# Patient Record
Sex: Male | Born: 1950
Health system: Southern US, Community
[De-identification: ages and names within clinical notes are randomized; demographics above are authoritative.]

## PROBLEM LIST (undated history)

## (undated) DIAGNOSIS — C099 Malignant neoplasm of tonsil, unspecified: Secondary | ICD-10-CM

## (undated) DIAGNOSIS — I10 Essential (primary) hypertension: Secondary | ICD-10-CM

## (undated) DIAGNOSIS — Z9289 Personal history of other medical treatment: Secondary | ICD-10-CM

## (undated) DIAGNOSIS — K219 Gastro-esophageal reflux disease without esophagitis: Secondary | ICD-10-CM

## (undated) DIAGNOSIS — E785 Hyperlipidemia, unspecified: Secondary | ICD-10-CM

## (undated) DIAGNOSIS — E039 Hypothyroidism, unspecified: Secondary | ICD-10-CM

## (undated) DIAGNOSIS — I255 Ischemic cardiomyopathy: Secondary | ICD-10-CM

## (undated) DIAGNOSIS — I2510 Atherosclerotic heart disease of native coronary artery without angina pectoris: Secondary | ICD-10-CM

## (undated) HISTORY — PX: NM MYOVIEW LTD: HXRAD82

## (undated) HISTORY — DX: Ischemic cardiomyopathy: I25.5

## (undated) HISTORY — DX: Hyperlipidemia, unspecified: E78.5

## (undated) HISTORY — DX: Personal history of other medical treatment: Z92.89

## (undated) HISTORY — DX: Atherosclerotic heart disease of native coronary artery without angina pectoris: I25.10

## (undated) HISTORY — DX: Gastro-esophageal reflux disease without esophagitis: K21.9

## (undated) HISTORY — DX: Hypothyroidism, unspecified: E03.9

---

## 1998-01-24 HISTORY — PX: CORONARY ARTERY BYPASS GRAFT: SHX141

## 1998-05-13 DIAGNOSIS — I25119 Atherosclerotic heart disease of native coronary artery with unspecified angina pectoris: Secondary | ICD-10-CM | POA: Insufficient documentation

## 1998-06-28 ENCOUNTER — Inpatient Hospital Stay (HOSPITAL_COMMUNITY): Admission: EM | Admit: 1998-06-28 | Discharge: 1998-07-04 | Payer: Self-pay | Admitting: Cardiovascular Disease

## 1998-06-30 ENCOUNTER — Encounter: Payer: Self-pay | Admitting: Cardiovascular Disease

## 1998-10-14 ENCOUNTER — Encounter: Payer: Self-pay | Admitting: Cardiovascular Disease

## 1998-10-14 ENCOUNTER — Ambulatory Visit (HOSPITAL_COMMUNITY): Admission: RE | Admit: 1998-10-14 | Discharge: 1998-10-15 | Payer: Self-pay | Admitting: Cardiovascular Disease

## 1998-12-31 ENCOUNTER — Inpatient Hospital Stay (HOSPITAL_COMMUNITY): Admission: EM | Admit: 1998-12-31 | Discharge: 1999-01-10 | Payer: Self-pay | Admitting: Emergency Medicine

## 1998-12-31 ENCOUNTER — Encounter: Payer: Self-pay | Admitting: Cardiology

## 1999-01-04 ENCOUNTER — Encounter: Payer: Self-pay | Admitting: Thoracic Surgery (Cardiothoracic Vascular Surgery)

## 1999-01-05 ENCOUNTER — Encounter: Payer: Self-pay | Admitting: Thoracic Surgery (Cardiothoracic Vascular Surgery)

## 1999-01-06 ENCOUNTER — Encounter: Payer: Self-pay | Admitting: Thoracic Surgery (Cardiothoracic Vascular Surgery)

## 1999-01-27 ENCOUNTER — Encounter: Payer: Self-pay | Admitting: Thoracic Surgery (Cardiothoracic Vascular Surgery)

## 1999-01-27 ENCOUNTER — Encounter
Admission: RE | Admit: 1999-01-27 | Discharge: 1999-01-27 | Payer: Self-pay | Admitting: Thoracic Surgery (Cardiothoracic Vascular Surgery)

## 2000-10-23 ENCOUNTER — Emergency Department (HOSPITAL_COMMUNITY): Admission: EM | Admit: 2000-10-23 | Discharge: 2000-10-24 | Payer: Self-pay | Admitting: Emergency Medicine

## 2000-10-25 ENCOUNTER — Emergency Department (HOSPITAL_COMMUNITY): Admission: EM | Admit: 2000-10-25 | Discharge: 2000-10-25 | Payer: Self-pay | Admitting: *Deleted

## 2001-01-10 ENCOUNTER — Emergency Department (HOSPITAL_COMMUNITY): Admission: EM | Admit: 2001-01-10 | Discharge: 2001-01-10 | Payer: Self-pay

## 2001-01-11 ENCOUNTER — Emergency Department (HOSPITAL_COMMUNITY): Admission: EM | Admit: 2001-01-11 | Discharge: 2001-01-11 | Payer: Self-pay | Admitting: Emergency Medicine

## 2002-07-04 ENCOUNTER — Ambulatory Visit (HOSPITAL_COMMUNITY): Admission: RE | Admit: 2002-07-04 | Discharge: 2002-07-04 | Payer: Self-pay | Admitting: Internal Medicine

## 2002-07-04 ENCOUNTER — Encounter: Payer: Self-pay | Admitting: Internal Medicine

## 2002-09-19 ENCOUNTER — Ambulatory Visit (HOSPITAL_COMMUNITY): Admission: RE | Admit: 2002-09-19 | Discharge: 2002-09-19 | Payer: Self-pay | Admitting: *Deleted

## 2002-09-19 ENCOUNTER — Encounter (INDEPENDENT_AMBULATORY_CARE_PROVIDER_SITE_OTHER): Payer: Self-pay | Admitting: Specialist

## 2005-06-22 ENCOUNTER — Emergency Department (HOSPITAL_COMMUNITY): Admission: EM | Admit: 2005-06-22 | Discharge: 2005-06-22 | Payer: Self-pay | Admitting: Emergency Medicine

## 2009-01-24 DIAGNOSIS — Z9289 Personal history of other medical treatment: Secondary | ICD-10-CM

## 2009-01-24 HISTORY — DX: Personal history of other medical treatment: Z92.89

## 2009-01-24 HISTORY — PX: CARDIAC CATHETERIZATION: SHX172

## 2009-02-18 ENCOUNTER — Encounter: Admission: RE | Admit: 2009-02-18 | Discharge: 2009-02-18 | Payer: Self-pay | Admitting: Cardiology

## 2009-02-20 ENCOUNTER — Ambulatory Visit (HOSPITAL_COMMUNITY): Admission: RE | Admit: 2009-02-20 | Discharge: 2009-02-20 | Payer: Self-pay | Admitting: Cardiology

## 2010-04-07 ENCOUNTER — Other Ambulatory Visit: Payer: Self-pay | Admitting: Otolaryngology

## 2010-04-07 DIAGNOSIS — R221 Localized swelling, mass and lump, neck: Secondary | ICD-10-CM

## 2010-04-09 ENCOUNTER — Encounter (HOSPITAL_COMMUNITY)
Admission: RE | Admit: 2010-04-09 | Discharge: 2010-04-09 | Disposition: A | Discharge status: Home / Self Care | Payer: BC Managed Care – PPO | Source: Ambulatory Visit | Attending: Otolaryngology | Admitting: Otolaryngology

## 2010-04-09 ENCOUNTER — Ambulatory Visit
Admission: RE | Admit: 2010-04-09 | Discharge: 2010-04-09 | Disposition: A | Discharge status: Home / Self Care | Payer: BC Managed Care – PPO | Source: Ambulatory Visit | Attending: Otolaryngology | Admitting: Otolaryngology

## 2010-04-09 DIAGNOSIS — R221 Localized swelling, mass and lump, neck: Secondary | ICD-10-CM

## 2010-04-09 LAB — BASIC METABOLIC PANEL
CO2: 30 mEq/L (ref 19–32)
GFR calc Af Amer: >60 mL/min (ref >60)
GFR calc non Af Amer: >60 mL/min (ref >60)
Glucose, Bld: 109 mg/dL — ABNORMAL HIGH (ref 70–99)
Sodium: 143 mEq/L (ref 135–145)

## 2010-04-09 LAB — CBC
HCT: 45.8 % (ref 39.0–52.0)
MCHC: 36.7 g/dL — ABNORMAL HIGH (ref 30.0–36.0)
Platelets: 239 10*3/uL (ref 150–400)
RDW: 13.3 % (ref 11.5–15.5)
WBC: 13.8 10*3/uL — ABNORMAL HIGH (ref 4.0–10.5)

## 2010-04-09 LAB — SURGICAL PCR SCREEN: Staphylococcus aureus: NEGATIVE

## 2010-04-09 MED ORDER — IOHEXOL 300 MG/ML  SOLN
75.0000 mL | Freq: Once | INTRAMUSCULAR | Status: AC | PRN
  Administered 2010-04-09: 75 mL via INTRAVENOUS

## 2010-04-12 ENCOUNTER — Other Ambulatory Visit: Payer: Self-pay | Admitting: Otolaryngology

## 2010-04-12 ENCOUNTER — Ambulatory Visit (HOSPITAL_COMMUNITY)
Admission: RE | Admit: 2010-04-12 | Discharge: 2010-04-12 | Disposition: A | Discharge status: Home / Self Care | Payer: BC Managed Care – PPO | Source: Ambulatory Visit | Attending: Otolaryngology | Admitting: Otolaryngology

## 2010-04-12 DIAGNOSIS — I10 Essential (primary) hypertension: Secondary | ICD-10-CM | POA: Insufficient documentation

## 2010-04-12 DIAGNOSIS — F17200 Nicotine dependence, unspecified, uncomplicated: Secondary | ICD-10-CM | POA: Insufficient documentation

## 2010-04-12 DIAGNOSIS — Z01818 Encounter for other preprocedural examination: Secondary | ICD-10-CM | POA: Insufficient documentation

## 2010-04-12 DIAGNOSIS — C099 Malignant neoplasm of tonsil, unspecified: Secondary | ICD-10-CM | POA: Insufficient documentation

## 2010-04-12 DIAGNOSIS — Z01812 Encounter for preprocedural laboratory examination: Secondary | ICD-10-CM | POA: Insufficient documentation

## 2010-04-12 DIAGNOSIS — I2510 Atherosclerotic heart disease of native coronary artery without angina pectoris: Secondary | ICD-10-CM | POA: Insufficient documentation

## 2010-04-19 NOTE — Op Note (Signed)
  NAMEDENNIE, Mark Mendoza                ACCOUNT NO.:  [id number]  MEDICAL RECORD NO.:  [id number]           PATIENT TYPE:  O  LOCATION:  SDSC                         FACILITY:  [facility]  PHYSICIAN:  Janeann Paisley H. Pollyann Kennedy, MD     DATE OF BIRTH:  Aug 28, 1950  DATE OF PROCEDURE:  04/12/2010 DATE OF DISCHARGE:  04/12/2010                              OPERATIVE REPORT   REFERRING PHYSICIAN:  Soyla Murphy. Pharr, MD  PREOPERATIVE DIAGNOSES:  Left tonsil mass and left neck mass.  POSTOPERATIVE DIAGNOSES:  Left tonsil mass frozen section diagnosis consistent with squamous cell carcinoma, invasive and very small right base of tongue mass.  No other findings.  HISTORY:  This is a 60 year old with a 81-month history of a sensation in his left throat area and more recently a lump in the left upper neck. Risks, benefits, alternatives, and complications of the procedure were explained to the patient, who seemed to understand and agreed with surgery.  PROCEDURE IN DETAIL:  The patient was taken to the operating room and placed on the operating table in supine position.  Following induction of general endotracheal anesthesia, the table was turned and the patient was draped in a standard fashion.  1. Esophagoscopy:  A rigid esophagoscope was entered into the oral     cavity through the cricopharyngeus into the esophagus.  It was     slowly withdrawn from the esophagus while suctioning secretions.     Circumferential inspection of the esophageal mucosa did not reveal     any lesions.  1. Direct laryngoscopy:  Jako laryngoscope was used to evaluate the     hypopharynx and laryngeal structures.  All areas were thoroughly     evaluated.  There were no lesions noted except for a very tiny     right base of tongue papillary-type lesion, which was biopsied in     its entirety and sent for pathologic evaluation and for an abnormal     left tonsil.  Left tonsil was inspected using the Crowe-Davis     mouthgag and the  entire tonsil was firm, enlarged, papillary-type     consistency without any superficial ulceration, but multiple     biopsies were taken using electrocautery and forceps as well as     biopsy forceps.  Suction cautery was used for hemostasis.  This was     sent for frozen section, which was consistent with squamous cell     carcinoma.  The pharynx was irrigated with saline and suctioned and     orogastric tube was used to aspirate contents of the stomach.  The     patient was then awakened, extubated, and transferred to recovery     in stable condition.     Neilson Oehlert H. Pollyann Kennedy, MD    JHR/MEDQ  D:  04/12/2010  T:  04/13/2010  Job:  [id number]  Electronically Signed by Serena Colonel MD on 04/19/2010 10:07:47 PM

## 2010-04-20 ENCOUNTER — Other Ambulatory Visit: Payer: Self-pay | Admitting: Oncology

## 2010-04-20 ENCOUNTER — Encounter (HOSPITAL_BASED_OUTPATIENT_CLINIC_OR_DEPARTMENT_OTHER): Payer: BC Managed Care – PPO | Admitting: Oncology

## 2010-04-20 ENCOUNTER — Encounter: Payer: BC Managed Care – PPO | Admitting: Oncology

## 2010-04-20 ENCOUNTER — Other Ambulatory Visit (HOSPITAL_COMMUNITY): Payer: BC Managed Care – PPO | Admitting: Dentistry

## 2010-04-20 DIAGNOSIS — K0530 Chronic periodontitis, unspecified: Secondary | ICD-10-CM

## 2010-04-20 DIAGNOSIS — C099 Malignant neoplasm of tonsil, unspecified: Secondary | ICD-10-CM

## 2010-04-20 DIAGNOSIS — K036 Deposits [accretions] on teeth: Secondary | ICD-10-CM

## 2010-04-20 DIAGNOSIS — M278 Other specified diseases of jaws: Secondary | ICD-10-CM

## 2010-04-20 DIAGNOSIS — M2607 Excessive tuberosity of jaw: Secondary | ICD-10-CM

## 2010-04-20 LAB — COMPREHENSIVE METABOLIC PANEL
ALT: 36 U/L (ref 0–53)
AST: 41 U/L — ABNORMAL HIGH (ref 0–37)
BUN: 14 mg/dL (ref 6–23)
CO2: 26 mEq/L (ref 19–32)
Chloride: 100 mEq/L (ref 96–112)
Creatinine, Ser: 1 mg/dL (ref 0.40–1.50)
Glucose, Bld: 123 mg/dL — ABNORMAL HIGH (ref 70–99)
Sodium: 137 mEq/L (ref 135–145)
Total Protein: 7.1 g/dL (ref 6.0–8.3)

## 2010-04-20 LAB — CBC WITH DIFFERENTIAL/PLATELET
EOS%: 0.3 % (ref 0.0–7.0)
LYMPH%: 20.8 % (ref 14.0–49.0)
MCHC: 35.8 g/dL (ref 32.0–36.0)
MONO#: 1.2 10*3/uL — ABNORMAL HIGH (ref 0.1–0.9)
MONO%: 9.8 % (ref 0.0–14.0)
Platelets: 276 10*3/uL (ref 140–400)
RDW: 13.2 % (ref 11.0–14.6)

## 2010-04-23 ENCOUNTER — Ambulatory Visit: Payer: BC Managed Care – PPO | Attending: Radiation Oncology | Admitting: Radiation Oncology

## 2010-04-23 DIAGNOSIS — Z951 Presence of aortocoronary bypass graft: Secondary | ICD-10-CM | POA: Insufficient documentation

## 2010-04-23 DIAGNOSIS — I2510 Atherosclerotic heart disease of native coronary artery without angina pectoris: Secondary | ICD-10-CM | POA: Insufficient documentation

## 2010-04-23 DIAGNOSIS — K219 Gastro-esophageal reflux disease without esophagitis: Secondary | ICD-10-CM | POA: Insufficient documentation

## 2010-04-23 DIAGNOSIS — E785 Hyperlipidemia, unspecified: Secondary | ICD-10-CM | POA: Insufficient documentation

## 2010-04-23 DIAGNOSIS — C099 Malignant neoplasm of tonsil, unspecified: Secondary | ICD-10-CM | POA: Insufficient documentation

## 2010-04-23 DIAGNOSIS — I10 Essential (primary) hypertension: Secondary | ICD-10-CM | POA: Insufficient documentation

## 2010-04-23 DIAGNOSIS — Z809 Family history of malignant neoplasm, unspecified: Secondary | ICD-10-CM | POA: Insufficient documentation

## 2010-04-23 DIAGNOSIS — Z510 Encounter for antineoplastic radiation therapy: Secondary | ICD-10-CM | POA: Insufficient documentation

## 2010-04-26 ENCOUNTER — Encounter (HOSPITAL_COMMUNITY)
Admission: RE | Admit: 2010-04-26 | Discharge: 2010-04-26 | Disposition: A | Discharge status: Home / Self Care | Payer: BC Managed Care – PPO | Source: Ambulatory Visit | Attending: Oncology | Admitting: Oncology

## 2010-04-26 ENCOUNTER — Encounter (HOSPITAL_COMMUNITY): Payer: Self-pay

## 2010-04-26 DIAGNOSIS — R599 Enlarged lymph nodes, unspecified: Secondary | ICD-10-CM | POA: Insufficient documentation

## 2010-04-26 DIAGNOSIS — C770 Secondary and unspecified malignant neoplasm of lymph nodes of head, face and neck: Secondary | ICD-10-CM | POA: Insufficient documentation

## 2010-04-26 DIAGNOSIS — C099 Malignant neoplasm of tonsil, unspecified: Secondary | ICD-10-CM | POA: Insufficient documentation

## 2010-04-26 LAB — GLUCOSE, CAPILLARY: Glucose-Capillary: 112 mg/dL — ABNORMAL HIGH (ref 70–99)

## 2010-04-26 MED ORDER — FLUDEOXYGLUCOSE F - 18 (FDG) INJECTION
17.4000 | Freq: Once | INTRAVENOUS | Status: AC | PRN
  Administered 2010-04-26: 17.4 via INTRAVENOUS

## 2010-05-04 ENCOUNTER — Encounter (HOSPITAL_BASED_OUTPATIENT_CLINIC_OR_DEPARTMENT_OTHER): Payer: BC Managed Care – PPO | Admitting: Oncology

## 2010-05-04 DIAGNOSIS — C099 Malignant neoplasm of tonsil, unspecified: Secondary | ICD-10-CM

## 2010-05-04 DIAGNOSIS — I10 Essential (primary) hypertension: Secondary | ICD-10-CM

## 2010-05-04 DIAGNOSIS — F17200 Nicotine dependence, unspecified, uncomplicated: Secondary | ICD-10-CM

## 2010-05-04 DIAGNOSIS — E119 Type 2 diabetes mellitus without complications: Secondary | ICD-10-CM

## 2010-05-26 ENCOUNTER — Other Ambulatory Visit: Payer: Self-pay | Admitting: Oncology

## 2010-05-26 ENCOUNTER — Encounter (HOSPITAL_BASED_OUTPATIENT_CLINIC_OR_DEPARTMENT_OTHER): Payer: BC Managed Care – PPO | Admitting: Oncology

## 2010-05-26 DIAGNOSIS — Z5111 Encounter for antineoplastic chemotherapy: Secondary | ICD-10-CM

## 2010-05-26 DIAGNOSIS — C099 Malignant neoplasm of tonsil, unspecified: Secondary | ICD-10-CM

## 2010-05-26 LAB — COMPREHENSIVE METABOLIC PANEL
Albumin: 4.5 g/dL (ref 3.5–5.2)
CO2: 25 mEq/L (ref 19–32)
Glucose, Bld: 116 mg/dL — ABNORMAL HIGH (ref 70–99)
Sodium: 138 mEq/L (ref 135–145)

## 2010-05-26 LAB — CBC WITH DIFFERENTIAL/PLATELET
HCT: 42.6 % (ref 38.4–49.9)
HGB: 15.3 g/dL (ref 13.0–17.1)
LYMPH%: 25.9 % (ref 14.0–49.0)
MCH: 33.5 pg — ABNORMAL HIGH (ref 27.2–33.4)
MCHC: 35.9 g/dL (ref 32.0–36.0)
MCV: 93.2 fL (ref 79.3–98.0)
MONO#: 1.3 10*3/uL — ABNORMAL HIGH (ref 0.1–0.9)
MONO%: 12.7 % (ref 0.0–14.0)
NEUT%: 59.8 % (ref 39.0–75.0)
Platelets: 263 10*3/uL (ref 140–400)
RDW: 14.2 % (ref 11.0–14.6)

## 2010-06-02 ENCOUNTER — Encounter (HOSPITAL_BASED_OUTPATIENT_CLINIC_OR_DEPARTMENT_OTHER): Payer: BC Managed Care – PPO | Admitting: Oncology

## 2010-06-02 ENCOUNTER — Other Ambulatory Visit: Payer: Self-pay | Admitting: Oncology

## 2010-06-02 DIAGNOSIS — E46 Unspecified protein-calorie malnutrition: Secondary | ICD-10-CM

## 2010-06-02 DIAGNOSIS — C099 Malignant neoplasm of tonsil, unspecified: Secondary | ICD-10-CM

## 2010-06-02 DIAGNOSIS — I10 Essential (primary) hypertension: Secondary | ICD-10-CM

## 2010-06-02 DIAGNOSIS — Z5111 Encounter for antineoplastic chemotherapy: Secondary | ICD-10-CM

## 2010-06-02 LAB — COMPREHENSIVE METABOLIC PANEL
AST: 25 U/L (ref 0–37)
CO2: 24 mEq/L (ref 19–32)
Chloride: 100 mEq/L (ref 96–112)
Creatinine, Ser: 0.68 mg/dL (ref 0.40–1.50)
Glucose, Bld: 101 mg/dL — ABNORMAL HIGH (ref 70–99)
Sodium: 135 mEq/L (ref 135–145)
Total Bilirubin: 0.7 mg/dL (ref 0.3–1.2)
Total Protein: 7.1 g/dL (ref 6.0–8.3)

## 2010-06-02 LAB — CBC WITH DIFFERENTIAL/PLATELET
BASO%: 0.3 % (ref 0.0–2.0)
EOS%: 0.7 % (ref 0.0–7.0)
LYMPH%: 14.5 % (ref 14.0–49.0)
MCHC: 35.8 g/dL (ref 32.0–36.0)
MCV: 92.7 fL (ref 79.3–98.0)
MONO#: 1.5 10*3/uL — ABNORMAL HIGH (ref 0.1–0.9)
MONO%: 14.9 % — ABNORMAL HIGH (ref 0.0–14.0)
NEUT%: 69.6 % (ref 39.0–75.0)
Platelets: 228 10*3/uL (ref 140–400)
RBC: 4.13 10*6/uL — ABNORMAL LOW (ref 4.20–5.82)
RDW: 13.4 % (ref 11.0–14.6)
nRBC: 0 % (ref 0–0)

## 2010-06-08 ENCOUNTER — Other Ambulatory Visit: Payer: Self-pay | Admitting: Oncology

## 2010-06-08 ENCOUNTER — Encounter (HOSPITAL_BASED_OUTPATIENT_CLINIC_OR_DEPARTMENT_OTHER): Payer: BC Managed Care – PPO | Admitting: Oncology

## 2010-06-08 DIAGNOSIS — C099 Malignant neoplasm of tonsil, unspecified: Secondary | ICD-10-CM

## 2010-06-08 LAB — COMPREHENSIVE METABOLIC PANEL
ALT: 17 U/L (ref 0–53)
Albumin: 3.7 g/dL (ref 3.5–5.2)
Alkaline Phosphatase: 102 U/L (ref 39–117)
CO2: 25 mEq/L (ref 19–32)
Calcium: 9.2 mg/dL (ref 8.4–10.5)
Chloride: 102 mEq/L (ref 96–112)
Potassium: 4.4 mEq/L (ref 3.5–5.3)
Total Bilirubin: 0.5 mg/dL (ref 0.3–1.2)
Total Protein: 6.3 g/dL (ref 6.0–8.3)

## 2010-06-08 LAB — CBC WITH DIFFERENTIAL/PLATELET
BASO%: 0 % (ref 0.0–2.0)
Basophils Absolute: 0 10*3/uL (ref 0.0–0.1)
EOS%: 0.8 % (ref 0.0–7.0)
Eosinophils Absolute: 0.1 10*3/uL (ref 0.0–0.5)
HGB: 12.7 g/dL — ABNORMAL LOW (ref 13.0–17.1)
LYMPH%: 5.6 % — ABNORMAL LOW (ref 14.0–49.0)
MONO#: 1.2 10*3/uL — ABNORMAL HIGH (ref 0.1–0.9)
RDW: 13.3 % (ref 11.0–14.6)
WBC: 9.1 10*3/uL (ref 4.0–10.3)

## 2010-06-09 ENCOUNTER — Encounter (HOSPITAL_BASED_OUTPATIENT_CLINIC_OR_DEPARTMENT_OTHER): Payer: BC Managed Care – PPO | Admitting: Oncology

## 2010-06-09 DIAGNOSIS — C099 Malignant neoplasm of tonsil, unspecified: Secondary | ICD-10-CM

## 2010-06-09 DIAGNOSIS — K121 Other forms of stomatitis: Secondary | ICD-10-CM

## 2010-06-09 DIAGNOSIS — Z5111 Encounter for antineoplastic chemotherapy: Secondary | ICD-10-CM

## 2010-06-09 DIAGNOSIS — C01 Malignant neoplasm of base of tongue: Secondary | ICD-10-CM

## 2010-06-11 NOTE — H&P (Signed)
[place]. [facility]  Patient:    Mark Mendoza                        MRN: [id number] Adm. Date:  [id number] Attending:  Ruta Hinds Dictator:   Moshe Salisbury, N.P., A.C.N.P. CC:         Richard A. Alanda Amass, M.D.                         History and Physical  CHIEF COMPLAINT:  Chest pain.  HISTORY OF PRESENT ILLNESS:  This is a 60 year old white male arriving at the emergency room in no acute distress complaining of chest pain.  He has a history of coronary artery disease having had a myocardial infarction in June of this year and having had TPA administered at that time followed up with cardiac catheterization and two stents being placed.  In August of this year, he had a rotablator procedure and another stent put in place.  This morning he had angina beginning at about 6 a.m. while he was walking.  He has had no chest pain like this since his heart attack, though he has had mild angina relieved by nitroglycerin since June. This time the pain was severe, it was across the upper part of his chest and into his throat.  He took nitroglycerin with relief.  He states that every time he had to walk, particularly up a hill or steps, he had chest pain for which he would take nitroglycerin and sit down.  The pain lasted for several minutes and was severe. He did not have nausea, shortness of breath, diaphoresis, or radiation.  He was out of town, so he had to drive home, about 147 miles.  Upon arriving, he called Dr. Mancel Parsons office and was sent to the emergency department by Dr. Dorethea Clan was on call.  PAST MEDICAL HISTORY:  The patients past medical history is significant for a myocardial infarction in June of this year.  He is status post PTCA with stent placement.  He has coronary artery disease, hyperlipidemia, and hypertension. e also has a significant history of smoking.  PAST SURGICAL HISTORY:  The patient had facial  reconstructive surgery when he was a child, and he has had PTCA with stents.  MEDICATION ALLERGIES:  The patient states that CODEINE causes nervousness.  MEDICATIONS:  1. Atenolol 25 mg 1/2 twice a day.  2. Prevacid 15 mg 1 twice a day.  3. Enteric-coated aspirin 325 mg once a day.  4. Lipitor 10 mg 1 at bedtime.  5. Niaspan 500 mg 2 at bedtime.  6. Imdur 60 mg 1 at bedtime.  7. Nitroglycerin 0.4 mg as needed.  8. Vitamin C 500 mg 1 a day.  9. Vitamin E 400 IU 2 every day. 10. Zoloft 25 mg 1 at bedtime. 11. One other medication which he cannot remember the name.  REVIEW OF SYSTEMS:  Constitutional:  The patient denies fevers, chills, diaphoresis, or edema.  He states that his weight is stable, and his sleep has improved.   Eyes: The patient denies blurring or diplopia.  He does wear glasses, but he does not have contacts.  He denies glaucoma and cataracts.  Ear, Nose, Mouth, Throat:  The patient denies deafness, tinnitus, rhinorrhea, sneezing, dysgnathia, or sores in his mouth.  He does have a complete set of upper dentures. Cardiovascular: The patient does have chest pain as noted.  He also has palpitations which consist of a skipping sensation in his chest.  He denies dyspnea, paroxysmal nocturnal dyspnea, orthopnea, and claudication. Respiratory: The patient denies or wheezing.  He no longer smokes.  Gastrointestinal:  The patient denies dysphagia, nausea, indigestion, diarrhea, or constipation. Genitourinary: The patient denies dysuria, pyuria, hematuria, and hesitation. e seldom has nocturia.  Musculoskeletal: The patient denies painful joints or myalgias.  He states that his gait is steady and that he has had weakness and fatigue for the last three days.  Skin: The patient denies rashes or other skin  problems.  Breasts: The patient denies masses, lumps, tenderness, and discharge of the breasts.   Neurologic: The patient denies faintness, syncope, dizziness, numbness,  seizures, or the signs and symptoms of a stroke.  Psychiatric: The patient states that he is often depressed and that he worries a great deal.  He has had some loss of appetite lately.  He does not have hallucinations. Endocrine: The patient denies diabetes or thyroid disease.  He denies excessive thirst, excessive hunger, or excessive urine volume output.  Hematologic: The patient states that he bruises easily.  Lymphatic: The patient denies adenopathy of the  neck, axillae, and groin.  Allergic:  The patient states that he cannot tolerate codeine which causes nervousness.  He denies any other allergies.  PHYSICAL EXAMINATION:  VITAL SIGNS:  The patients blood pressure upon admission was 173/83.  His pulse  was 71.  His respirations were 22.  His temperature was 97.9 degrees F orally. His pulse oximetry was 99%.  GENERAL:  The patient is a well-developed, well-nourished, white male in no acute distress.  He is 60 years old and mildly obese.  HEENT:  The patient is normocephalic, atraumatic.  His pupils are equal, round nd reactive to light and 2 mm in diameter.  His mouth is moist.  His oropharynx is  benign.  He has a complete set of upper dentures in his mouth.  NECK:  The patients neck is supple.  His trachea is midline.  He is without jugular venous distension, bruit, or thyromegaly.  ADENOPATHY:  The patient is without cervical adenopathy.  CHEST:  The patient is on O2 at 2 liters by nasal cannula. He is eupneic.  His chest is clear to auscultation and percussion.  HIs chest is nontender to palpation, deep inspiration, or range of motion.  BREASTS:  The patients breasts are of normal contour without discharge or tenderness.  CARDIAC:  The patient has a regular rate and rhythm.  S1 and S2 are heard, though heart sounds are distant.  No murmurs, gallops, rubs, or clicks are auscultated.  ABDOMEN:  The patients abdomen is nontender but obese.  He has positive  bowel sounds throughout.  No bruit is heard.  GENITOURINARY:  The patient is nontender over his bladder.  SKIN:  The patients skin is warm and dry without jaundice, cyanosis, or pallor.   He does not have any apparent rashes, and his capillary refill is normal.  EXTREMITIES:  The patient moves all extremities x 4.  His strength is 5/5 in his upper and lower extremities.  He is without ankle edema.  NEUROLOGIC:  The patient is conscious, alert, and oriented to person, place, time, and situation.  He is without tremor.  Cranial nerves II-XII grossly intact.  LABORATORY DATA:  Labs are pending.  EKG shows normal sinus rhythm with a prior septal infarct.  His chest x-ray shows no acute disease.  IMPRESSION: 1. Unstable angina.  2. Coronary artery disease status post myocardial infarction. 3. Hypertension. 4. Hyperlipidemia.  PLAN: 1. Admit to telemetry. 2. Cardiac catheterization tomorrow. 3. Serial enzymes and EKGs. 4. Nitroglycerin drip. 5. Anticoagulation with history of by pharmacy. DD:  12/31/98 TD:  12/31/98 Job: [id number]

## 2010-06-11 NOTE — Op Note (Signed)
   NAME:  Mark Mendoza, Mark Mendoza                          ACCOUNT NO.:  [id number]   MEDICAL RECORD NO.:  [id number]                   PATIENT TYPE:  AMB   LOCATION:  ENDO                                 FACILITY:  [facility]   PHYSICIAN:  Georgiana Spinner, M.D.                 DATE OF BIRTH:  [birth year]   DATE OF PROCEDURE:  DATE OF DISCHARGE:                                 OPERATIVE REPORT   PROCEDURE PERFORMED:  Upper endoscopy with biopsy.   ENDOSCOPIST:  Georgiana Spinner, M.D.   INDICATIONS FOR PROCEDURE:  Question of reflux.   ANESTHESIA:  Demerol 60 mg, Versed 6 mg.   DESCRIPTION OF PROCEDURE:  With the patient mildly sedated in the left  lateral decubitus position, the Olympus video endoscope was inserted in the  mouth and passed under direct vision through the esophagus which appeared  normal.  Subsequently, there was apparently an area that appeared to be  possibly Barrett's which we subsequently photographed and biopsied.  We  entered into the stomach.  The fundus, body, antrum, duodenal bulb and  second portion of the duodenum all appeared normal.  From this point, the  endoscope was slowly withdrawn taking circumferential views of the duodenal  mucosa until the endoscope was pulled back into the stomach and placed on  retroflexion to view the stomach from below.  The endoscope was then  straightened and withdrawn taking circumferential views of the remaining  gastric and esophageal mucosa.  The patient's vital signs and pulse oximeter  remained stable.  The patient tolerated the procedure well without apparent  complications.   FINDINGS:  Question of Barrett's, biopsied, await biopsy report.  Patient  will call me for results and follow-up as an outpatient. Proceed to  colonoscopy as planned.                                                 Georgiana Spinner, M.D.    GMO/MEDQ  D:  09/19/2002  T:  09/19/2002  Job:  [id number]   cc:   Gerlene Burdock A. Alanda Amass, M.D.  [phone] [address]  [place]  [place] [address]  Fax: [phone]

## 2010-06-11 NOTE — Op Note (Signed)
   NAME:  Mark Mendoza, Mark Mendoza                          ACCOUNT NO.:  [id number]   MEDICAL RECORD NO.:  [id number]                   PATIENT TYPE:  AMB   LOCATION:  ENDO                                 FACILITY:  [facility]   PHYSICIAN:  Georgiana Spinner, M.D.                 DATE OF BIRTH:  03-25-1950   DATE OF PROCEDURE:  09/19/2002  DATE OF DISCHARGE:                                 OPERATIVE REPORT   PROCEDURE:  Colonoscopy.   ANESTHESIA:  Demerol 40 mg, Versed 4 mg.   INDICATIONS:  Rectal bleeding.   DESCRIPTION OF PROCEDURE:  With the patient mildly sedated in the left  lateral decubitus position, the Olympus videoscopic colonoscope was inserted  in the rectum and passed under direct vision to the cecum, identified by  ileocecal valve and appendiceal orifice, both of which were photographed.  From this point the colonoscope was slowly withdrawn, taking circumferential  views of the entire colonic mucosa, stopping only in the rectum, which  appeared normal on direct and showed hemorrhoids on retroflexed view.  The  endoscope was straightened and withdrawn.  The patient's vital signs and  pulse oximetry remained stable.  The patient tolerated the procedure well  without apparent complications.   FINDINGS:  Internal hemorrhoids, otherwise unremarkable examination.   PLAN:  See endoscopy note for further details.                                               Georgiana Spinner, M.D.    GMO/MEDQ  D:  09/19/2002  T:  09/19/2002  Job:  [id number]   cc:   Gerlene Burdock A. Alanda Amass, M.D.  [phone] [address]  [place]  [place] [address]  Fax: [phone]

## 2010-06-11 NOTE — Op Note (Signed)
[place]. [facility]  Patient:    Mark Mendoza                        MRN: [id number] Proc. Date: 01/04/99 Adm. Date:  [id number] Attending:  Charlett Lango CC:         Richard A. Alanda Amass, M.D.             Dr. Su Hilt                           Operative Report  PREOPERATIVE DIAGNOSIS:  Unstable angina, severe three-vessel coronary disease.  POSTOPERATIVE DIAGNOSIS:  Unstable angina, severe three-vessel coronary disease.  OPERATION PERFORMED:  Median sternotomy, extracorporeal circulation, coronary artery bypass grafting x 4 (left internal mammary artery to the left anterior descending, sequential saphenous vein graft to the first diagonal and first obtuse marginal, saphenous vein graft to posterior descending).  SURGEON:  Salvatore Decent. Dorris Fetch, M.D.  ASSISTANTLuretha Rued. Ezzard Standing, P.A.  ANESTHESIA:  General.  FINDINGS:  Good quality conduits, good quality targets.  Posterolateral too small to graft.  INDICATIONS FOR PROCEDURE:  [name] is a 60 year old gentleman who first developed heart disease in June of 2000.  He has had multiple percutaneous procedures and now presents with recurrent unstable angina.  The patient has had progression of his three-vessel disease and has also developed left main disease. The indications, risks, benefits and alternatives to coronary artery bypass grafting were discussed in detail with the patient by Dr. Donata Clay.  The patient understood these indications, risks and benefits and had all his questions answered.  He understood all the issues involved and agreed to proceed.  DESCRIPTION OF PROCEDURE:  [name] was brought to the preop holding area on January 04, 1999.  Lines were placed to monitor arterial, central venous and pulmonary arterial pressure.  EKG leads were placed for continuous telemetry. he patient was taken to the operating room, anesthetized and intubated. Intravenous antibiotics  were administered.  A Foley catheter was placed.  The chest, abdomen and legs were prepped and draped in the usual fashion.  A median sternotomy was  performed.  Simultaneously, an incision was made in the medial aspect of the left leg and the greater saphenous vein was harvested from the ankle to just above the knee.  The left internal mammary artery was harvested in standard fashion.  The  patient was fully heparinized prior to dividing the distal end of the mammary artery.  There was good flow through the cut end of the vessel.  After ensuring  that all branches had been clipped, the mammary was placed in a papaverine soaked sponge and placed into the left pleural space.  The pericardium was opened. The ascending aorta was inspected.  There was no palpable atherosclerotic disease. The aorta was cannulated via concentric 2-0 Ethibond nonpledgeted pursestring sutures. A dual stage venous cannula was placed via pursestring suture in the right atrial appendage.  Cardiopulmonary bypass was instituted and the patient was cooled to 32 degrees Celsius.  The coronary arteries were inspected and anastomotic sites were chosen.  The conduits were inspected and cut to length.  A foam pad was placed n the pericardium.  A temperature probe was placed in the myocardial septum.  A cardioplegia was placed in the ascending aorta.  The aorta was crossclamped. Cardiac arrest was achieved with a combination of cold antegrade cardioplegia and topical iced saline.  After achieving complete diastolic arrest, the following distal anastomoses were performed.  First, a reversed saphenous vein graft was placed end-to-side to the posterior descending coronary artery.  The posterior descending was a 1.5 mm vessel which was good quality at the site of the anastomosis.  (Both proximal and distal to the ite of the anastomosis.)  The anastomosis was performed with a running 7-0 Prolene suture.  Saphenous  vein was a good quality graft.  There was good flow through he graft at the completion of the anastomosis.  Additional cardioplegia was administered down the graft.  Of note, the posterolateral branch of the right coronary was too small to graft.  Next, a reversed saphenous vein was placed sequentially to the first diagonal. The first obtuse marginal.  The first diagonal branch was a high anterolateral branch off the LAD.  It was a 2.0 mm good quality target.  It was compromised by a tight LAD stenosis both proximal and distal to its origin.  A side-to-side anastomosis was performed off a branch of the vein graft using a running 7-0 Prolene suture. An end-to-side anastomosis then was performed to the first obtuse marginal which was a 1.5 mm good quality target.  Both these vessels were intramyocardial.  After administering additional cardioplegia down the vein grafts, the left internal mammary artery was brought through a window in the pericardium anterior to the eft phrenic nerve.  The distal end was spatulated.  The distal LAD was opened.  It as a 1.5 mm vessel at the site of the anastomosis.  The probe passed easily proximally and distally.  The anastomosis was performed end-to-side with a running 8-0 Prolene suture.  At the completion of the mammary to LAD anastomosis, the bulldog clamp was removed from the mammary artery.  Immediate and rapid septal rewarming was noted. The aortic crossclamp was removed.  Lidocaine was administered. Total crossclamp time was 53 minutes.  The mammary pedicle was tacked to the epicardial surface f the heart with interrupted 6-0 Prolene sutures.  The partial occlusion clamp was placed on the ascending aorta.  The two vein graft proximal anastomoses were performed to 4.4 mm punch aortotomies with running 6-0 Prolene sutures.  At completion of the final proximal anastomosis, the patient as placed head down.  Air was allowed to escape as  the partial clamp was removed prior to tying the final suture.  Air was then aspirated from the vein grafts.  The bulldog clamps were removed.  Flow was restored.  All proximal and distal  anastomoses were inspected for hemostasis.  The patient was rewarmed. Epicardial pacing wires were placed on the right ventricle and right atrium.  The patient as weaned from cardiopulmonary bypass when core temperature reached 37 degrees Celsius.  Total bypass time was 108 minutes.  Patient weaned from bypass in sinus rhythm with no inotropic support.  Initial cardiac index was greater than 3 L per minute and the patient remained hemodynamically stable throughout the remainder of the postbypass period.  A test dose of protamine was administered and was well tolerated.  The atrial and aortic cannulae were removed.  The remainder of the protamine was administered without incident.  An additional dose of antibiotics was administered. Hemostasis was achieved.  The chest was irrigated with 1 L of warm normal saline containing 1 gm of vancomycin.  A left pleural and two mediastinal chest tubes were placed  through separate subcostal incisions.  The pericardium was reapproximated with interrupted 3-0 silk sutures.  The  sternum was closed with stainless steel wires and the pectoralis fascia was closed with a running #1 Vicryl suture.  Both the  chest and leg subcutaneous tissue was closed with a running 2-0 Vicryl subcutaneous suture and a running 3-0 Vicryl subcuticular suture.  All sponge, needle and instrument counts were correct at the end of the procedure.  There were no intraoperative complications.  The patient was taken from the operating room to the surgical intensive care unit intubated and in stable condition. DD:  01/04/99 TD:  01/05/99 Job: [id number]

## 2010-06-11 NOTE — Cardiovascular Report (Signed)
[place]. [facility]  Patient:    Mark Mendoza                        MRN: [id number] Proc. Date: 01/01/99 Adm. Date:  [id number] Attending:  Ruta Hinds CC:         Richard A. Alanda Amass, M.D.             [facility]             Cardiac Catheterization Lab                        Cardiac Catheterization  PROCEDURES PERFORMED: 1. Selective coronary angiography by Judkins technique. 2. Selective visualization of the left subclavian and internal mammary artery. 3. Retrograde left heart catheterization. 4. Left ventricular angiography.  COMPLICATIONS:  None.  ENTRY SITE:  Right femoral.  DYE USED:  Omnipaque.  MEDICATIONS GIVEN:  Versed 1 mg on two occasions.  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  He is brought to the catheterization lab for reinvestigation f coronary anatomy in view of previous stents placed to the LAD and the RCA.  The patient had an acute myocardial infarction in June of this year.  He had ventricular fibrillation at that time.  He had tissue plasminogen activator therapy and underwent LAD stenting, mid right coronary artery stenting, and his diagonal branch of the left coronary artery was balloon angioplastied.  In September, he had in-stent restenosis of his LAD and underwent rotational atherectomy.  He now presents for the third time with anginal symptoms for re-evaluation of coronary anatomy.  RESULTS:  Pressures:  The left ventricular pressure was 130/16, central aortic pressure 130/80, mean of 100.  No aortic valve gradient by pullback technique.  1. The left main coronary artery is diseased!  The 6-French Judkins left diagnostic    catheter could not intubate the ostium of the vessel at any time during this    mornings procedures without  causing immediate damping of pressure from 130 down    to almost 30.  After 200 mcg of intracoronary nitroglycerin were instilled,  again took an ostial injection, and the vessel looked as it did during the    previous injections and during all subsequent injections.  The vessel basically    now has a diffusely narrowed look with a 50% stenosis of the left main coronary    artery! 2. The left anterior descending coronary artery has a short proximal segment which    is fairly normal.  There is a septal perforator branch within 15-20 mm of the    ostium of the vessel, then the mid LAD arises containing two overlapping    radiopaque stents.  Just before the most proximal end of the two stents, there    is a 75% stenosis, and the second of two stents located in the mid LAD contains    a long segmental 75% in-stent restenosis.  The distal LAD beyond a third septal    perforator branch does contain a stenosis of approximately 60-70%.  There is a    long segment of distal LAD which appears to have a good caliber to the vessel    and a significant length which is bypassable.  The first diagonal branch of he  LAD shows an ostial stenosis of approaching 90%.  The second tiny diagonal    branch is very small and doubtfully bypassable.  The third diagonal branch and    all subsequent diagonal branches do not appear with high grade disease but are    too small to bypass. 3. The left circumflex coronary artery is nondominant.  There is one obtuse    marginal branch which proximally looks 60% narrow which does appear bypassable. 4. The right coronary artery is dominant.  There is a radiopaque stent in the mid    portion of the vessel; however, the bifurcation of the distal RCA into a    continuation branch and into a PDA is diseased.  The continuation branch    contains an 80-90% ostial stenosis.  The distal RCA and a proximal PDA contains    a 60-75% stenosis.  Both the continuation and  the PDA branches appear    bypassable.  The left ventricle shows good contractility of the basal segments and of the inferior wall.  The high anterolateral wall is mildly hypokinetic.  The apex looks rather akinetic.  I do not see LV thrombus.  I do not see mitral regurgitation. I would estimate ejection fraction at 50% plus or minus 5%.  The left internal mammary artery and the left subclavian arteries appear to be without significant stenotic lesions present.  FINAL DIAGNOSES: 1. Multivessel coronary artery disease.    a. Left main stenosis, 50-60% diffuse and new from three months ago.    b. In-stent restenosis of mid left anterior descending artery, 75% with a 60-75%       lesion in a more distal portion of the left anterior descending artery (the       very distal left anterior descending artery is definitely bypassable).    c. Ostial first diagonal branch stenosis of 90%.    d. A 60% obtuse marginal branch stenosis.    e. Distal right coronary artery lesions of 90% at the ostium of the continuation       branch and 60% in the proximal posterior descending branch. 2. Mild left ventricular dysfunction involving apex and high anterolateral wall.  RECOMMENDATIONS:  Coronary artery bypass grafting. DD:  01/01/99 TD:  01/03/99 Job: [id number]

## 2010-06-15 ENCOUNTER — Other Ambulatory Visit: Payer: Self-pay | Admitting: Oncology

## 2010-06-15 ENCOUNTER — Encounter (HOSPITAL_BASED_OUTPATIENT_CLINIC_OR_DEPARTMENT_OTHER): Payer: BC Managed Care – PPO | Admitting: Oncology

## 2010-06-15 DIAGNOSIS — E46 Unspecified protein-calorie malnutrition: Secondary | ICD-10-CM

## 2010-06-15 DIAGNOSIS — C01 Malignant neoplasm of base of tongue: Secondary | ICD-10-CM

## 2010-06-15 DIAGNOSIS — K1230 Oral mucositis (ulcerative), unspecified: Secondary | ICD-10-CM

## 2010-06-15 DIAGNOSIS — C099 Malignant neoplasm of tonsil, unspecified: Secondary | ICD-10-CM

## 2010-06-15 DIAGNOSIS — K121 Other forms of stomatitis: Secondary | ICD-10-CM

## 2010-06-15 LAB — COMPREHENSIVE METABOLIC PANEL
ALT: 19 U/L (ref 0–53)
CO2: 23 mEq/L (ref 19–32)
Calcium: 9.2 mg/dL (ref 8.4–10.5)
Chloride: 103 mEq/L (ref 96–112)
Creatinine, Ser: 0.74 mg/dL (ref 0.40–1.50)
Glucose, Bld: 146 mg/dL — ABNORMAL HIGH (ref 70–99)
Sodium: 137 mEq/L (ref 135–145)

## 2010-06-15 LAB — CBC WITH DIFFERENTIAL/PLATELET
BASO%: 0.1 % (ref 0.0–2.0)
Eosinophils Absolute: 0 10*3/uL (ref 0.0–0.5)
HCT: 36.3 % — ABNORMAL LOW (ref 38.4–49.9)
LYMPH%: 6.1 % — ABNORMAL LOW (ref 14.0–49.0)
MCH: 34.1 pg — ABNORMAL HIGH (ref 27.2–33.4)
MCHC: 34.6 g/dL (ref 32.0–36.0)
MONO#: 0.7 10*3/uL (ref 0.1–0.9)
MONO%: 9.6 % (ref 0.0–14.0)
NEUT#: 5.9 10*3/uL (ref 1.5–6.5)
NEUT%: 83.7 % — ABNORMAL HIGH (ref 39.0–75.0)

## 2010-06-15 LAB — MAGNESIUM: Magnesium: 1.7 mg/dL (ref 1.5–2.5)

## 2010-06-16 ENCOUNTER — Encounter (HOSPITAL_BASED_OUTPATIENT_CLINIC_OR_DEPARTMENT_OTHER): Payer: BC Managed Care – PPO | Admitting: Oncology

## 2010-06-16 DIAGNOSIS — C01 Malignant neoplasm of base of tongue: Secondary | ICD-10-CM

## 2010-06-16 DIAGNOSIS — C099 Malignant neoplasm of tonsil, unspecified: Secondary | ICD-10-CM

## 2010-06-16 DIAGNOSIS — Z5111 Encounter for antineoplastic chemotherapy: Secondary | ICD-10-CM

## 2010-06-17 ENCOUNTER — Ambulatory Visit (HOSPITAL_COMMUNITY)
Admission: RE | Admit: 2010-06-17 | Discharge: 2010-06-17 | Disposition: A | Discharge status: Home / Self Care | Payer: BC Managed Care – PPO | Source: Ambulatory Visit | Attending: Oncology | Admitting: Oncology

## 2010-06-17 DIAGNOSIS — C099 Malignant neoplasm of tonsil, unspecified: Secondary | ICD-10-CM | POA: Insufficient documentation

## 2010-06-22 ENCOUNTER — Other Ambulatory Visit: Payer: Self-pay | Admitting: Oncology

## 2010-06-22 ENCOUNTER — Ambulatory Visit (HOSPITAL_COMMUNITY): Payer: BC Managed Care – PPO

## 2010-06-22 ENCOUNTER — Ambulatory Visit (HOSPITAL_COMMUNITY): Admission: RE | Admit: 2010-06-22 | Payer: BC Managed Care – PPO | Source: Ambulatory Visit

## 2010-06-22 ENCOUNTER — Encounter (HOSPITAL_BASED_OUTPATIENT_CLINIC_OR_DEPARTMENT_OTHER): Payer: BC Managed Care – PPO | Admitting: Oncology

## 2010-06-22 ENCOUNTER — Ambulatory Visit (HOSPITAL_COMMUNITY)
Admission: RE | Admit: 2010-06-22 | Discharge: 2010-06-22 | Disposition: A | Discharge status: Home / Self Care | Payer: BC Managed Care – PPO | Source: Ambulatory Visit | Attending: Oncology | Admitting: Oncology

## 2010-06-22 DIAGNOSIS — K121 Other forms of stomatitis: Secondary | ICD-10-CM

## 2010-06-22 DIAGNOSIS — C099 Malignant neoplasm of tonsil, unspecified: Secondary | ICD-10-CM

## 2010-06-22 DIAGNOSIS — E46 Unspecified protein-calorie malnutrition: Secondary | ICD-10-CM

## 2010-06-22 DIAGNOSIS — C109 Malignant neoplasm of oropharynx, unspecified: Secondary | ICD-10-CM | POA: Insufficient documentation

## 2010-06-22 DIAGNOSIS — C01 Malignant neoplasm of base of tongue: Secondary | ICD-10-CM

## 2010-06-22 LAB — CBC WITH DIFFERENTIAL/PLATELET
Basophils Absolute: 0 10*3/uL (ref 0.0–0.1)
HCT: 33.2 % — ABNORMAL LOW (ref 38.4–49.9)
LYMPH%: 8 % — ABNORMAL LOW (ref 14.0–49.0)
MCH: 34.6 pg — ABNORMAL HIGH (ref 27.2–33.4)
Platelets: 166 10*3/uL (ref 140–400)
RBC: 3.4 10*6/uL — ABNORMAL LOW (ref 4.20–5.82)
lymph#: 0.4 10*3/uL — ABNORMAL LOW (ref 0.9–3.3)

## 2010-06-22 LAB — COMPREHENSIVE METABOLIC PANEL
Alkaline Phosphatase: 105 U/L (ref 39–117)
BUN: 22 mg/dL (ref 6–23)
Creatinine, Ser: 0.71 mg/dL (ref 0.40–1.50)
Glucose, Bld: 105 mg/dL — ABNORMAL HIGH (ref 70–99)
Sodium: 138 mEq/L (ref 135–145)
Total Bilirubin: 0.5 mg/dL (ref 0.3–1.2)

## 2010-06-22 MED ORDER — IOHEXOL 300 MG/ML  SOLN
50.0000 mL | Freq: Once | INTRAMUSCULAR | Status: AC | PRN
Start: 2010-06-22 — End: 2010-06-22

## 2010-06-23 ENCOUNTER — Encounter (HOSPITAL_BASED_OUTPATIENT_CLINIC_OR_DEPARTMENT_OTHER): Payer: BC Managed Care – PPO | Admitting: Oncology

## 2010-06-23 DIAGNOSIS — C01 Malignant neoplasm of base of tongue: Secondary | ICD-10-CM

## 2010-06-23 DIAGNOSIS — C099 Malignant neoplasm of tonsil, unspecified: Secondary | ICD-10-CM

## 2010-06-23 DIAGNOSIS — Z5111 Encounter for antineoplastic chemotherapy: Secondary | ICD-10-CM

## 2010-06-29 ENCOUNTER — Encounter (HOSPITAL_BASED_OUTPATIENT_CLINIC_OR_DEPARTMENT_OTHER): Payer: BC Managed Care – PPO | Admitting: Oncology

## 2010-06-29 ENCOUNTER — Other Ambulatory Visit: Payer: Self-pay | Admitting: Oncology

## 2010-06-29 DIAGNOSIS — C099 Malignant neoplasm of tonsil, unspecified: Secondary | ICD-10-CM

## 2010-06-29 LAB — COMPREHENSIVE METABOLIC PANEL
CO2: 32 mEq/L (ref 19–32)
Calcium: 9.4 mg/dL (ref 8.4–10.5)
Chloride: 98 mEq/L (ref 96–112)
Creatinine, Ser: 1.09 mg/dL (ref 0.50–1.35)
Glucose, Bld: 151 mg/dL — ABNORMAL HIGH (ref 70–99)
Sodium: 135 mEq/L (ref 135–145)
Total Bilirubin: 0.5 mg/dL (ref 0.3–1.2)
Total Protein: 6.6 g/dL (ref 6.0–8.3)

## 2010-06-29 LAB — CBC WITH DIFFERENTIAL/PLATELET
Eosinophils Absolute: 0 10*3/uL (ref 0.0–0.5)
HCT: 32.4 % — ABNORMAL LOW (ref 38.4–49.9)
LYMPH%: 11.4 % — ABNORMAL LOW (ref 14.0–49.0)
MONO#: 0.3 10*3/uL (ref 0.1–0.9)
NEUT#: 1.4 10*3/uL — ABNORMAL LOW (ref 1.5–6.5)
NEUT%: 72.1 % (ref 39.0–75.0)
Platelets: 119 10*3/uL — ABNORMAL LOW (ref 140–400)
WBC: 2 10*3/uL — ABNORMAL LOW (ref 4.0–10.3)
lymph#: 0.2 10*3/uL — ABNORMAL LOW (ref 0.9–3.3)

## 2010-06-29 LAB — MAGNESIUM: Magnesium: 1.7 mg/dL (ref 1.5–2.5)

## 2010-06-30 ENCOUNTER — Encounter (HOSPITAL_BASED_OUTPATIENT_CLINIC_OR_DEPARTMENT_OTHER): Payer: BC Managed Care – PPO | Admitting: Oncology

## 2010-06-30 DIAGNOSIS — C01 Malignant neoplasm of base of tongue: Secondary | ICD-10-CM

## 2010-06-30 DIAGNOSIS — C099 Malignant neoplasm of tonsil, unspecified: Secondary | ICD-10-CM

## 2010-06-30 DIAGNOSIS — D649 Anemia, unspecified: Secondary | ICD-10-CM

## 2010-06-30 DIAGNOSIS — Z5111 Encounter for antineoplastic chemotherapy: Secondary | ICD-10-CM

## 2010-07-01 ENCOUNTER — Other Ambulatory Visit: Payer: Self-pay | Admitting: Oncology

## 2010-07-01 DIAGNOSIS — C099 Malignant neoplasm of tonsil, unspecified: Secondary | ICD-10-CM

## 2010-07-07 ENCOUNTER — Other Ambulatory Visit: Payer: Self-pay | Admitting: Oncology

## 2010-07-07 ENCOUNTER — Encounter (HOSPITAL_BASED_OUTPATIENT_CLINIC_OR_DEPARTMENT_OTHER): Payer: BC Managed Care – PPO | Admitting: Oncology

## 2010-07-07 DIAGNOSIS — Z5111 Encounter for antineoplastic chemotherapy: Secondary | ICD-10-CM

## 2010-07-07 DIAGNOSIS — C01 Malignant neoplasm of base of tongue: Secondary | ICD-10-CM

## 2010-07-07 DIAGNOSIS — K117 Disturbances of salivary secretion: Secondary | ICD-10-CM

## 2010-07-07 DIAGNOSIS — C099 Malignant neoplasm of tonsil, unspecified: Secondary | ICD-10-CM

## 2010-07-07 DIAGNOSIS — R110 Nausea: Secondary | ICD-10-CM

## 2010-07-07 LAB — COMPREHENSIVE METABOLIC PANEL
ALT: 12 U/L (ref 0–53)
AST: 17 U/L (ref 0–37)
Albumin: 3.9 g/dL (ref 3.5–5.2)
Alkaline Phosphatase: 92 U/L (ref 39–117)
BUN: 28 mg/dL — ABNORMAL HIGH (ref 6–23)
Calcium: 9.2 mg/dL (ref 8.4–10.5)
Chloride: 99 mEq/L (ref 96–112)
Potassium: 4.4 mEq/L (ref 3.5–5.3)
Sodium: 137 mEq/L (ref 135–145)
Total Protein: 6.4 g/dL (ref 6.0–8.3)

## 2010-07-07 LAB — CBC WITH DIFFERENTIAL/PLATELET
BASO%: 0 % (ref 0.0–2.0)
EOS%: 0.5 % (ref 0.0–7.0)
HCT: 28.6 % — ABNORMAL LOW (ref 38.4–49.9)
MCH: 33.1 pg (ref 27.2–33.4)
MCHC: 35.3 g/dL (ref 32.0–36.0)
MONO#: 0.5 10*3/uL (ref 0.1–0.9)
NEUT%: 57.1 % (ref 39.0–75.0)
RDW: 13.3 % (ref 11.0–14.6)
WBC: 1.9 10*3/uL — ABNORMAL LOW (ref 4.0–10.3)
lymph#: 0.3 10*3/uL — ABNORMAL LOW (ref 0.9–3.3)
nRBC: 0 % (ref 0–0)

## 2010-08-06 ENCOUNTER — Ambulatory Visit
Admission: RE | Admit: 2010-08-06 | Discharge: 2010-08-06 | Disposition: A | Discharge status: Home / Self Care | Payer: BC Managed Care – PPO | Source: Ambulatory Visit | Attending: Radiation Oncology | Admitting: Radiation Oncology

## 2010-09-01 ENCOUNTER — Other Ambulatory Visit: Payer: Self-pay | Admitting: Oncology

## 2010-09-01 ENCOUNTER — Encounter (HOSPITAL_BASED_OUTPATIENT_CLINIC_OR_DEPARTMENT_OTHER): Payer: BC Managed Care – PPO | Admitting: Oncology

## 2010-09-01 DIAGNOSIS — R110 Nausea: Secondary | ICD-10-CM

## 2010-09-01 DIAGNOSIS — K117 Disturbances of salivary secretion: Secondary | ICD-10-CM

## 2010-09-01 DIAGNOSIS — C099 Malignant neoplasm of tonsil, unspecified: Secondary | ICD-10-CM

## 2010-09-01 LAB — CBC WITH DIFFERENTIAL/PLATELET
EOS%: 2.7 % (ref 0.0–7.0)
Eosinophils Absolute: 0.2 10*3/uL (ref 0.0–0.5)
LYMPH%: 7.3 % — ABNORMAL LOW (ref 14.0–49.0)
MCH: 36.5 pg — ABNORMAL HIGH (ref 27.2–33.4)
MCV: 104.7 fL — ABNORMAL HIGH (ref 79.3–98.0)
MONO%: 9 % (ref 0.0–14.0)
NEUT#: 6.3 10*3/uL (ref 1.5–6.5)
Platelets: 218 10*3/uL (ref 140–400)
RBC: 3.03 10*6/uL — ABNORMAL LOW (ref 4.20–5.82)
RDW: 16.2 % — ABNORMAL HIGH (ref 11.0–14.6)

## 2010-09-01 LAB — COMPREHENSIVE METABOLIC PANEL
AST: 16 U/L (ref 0–37)
Alkaline Phosphatase: 93 U/L (ref 39–117)
BUN: 15 mg/dL (ref 6–23)
Glucose, Bld: 148 mg/dL — ABNORMAL HIGH (ref 70–99)
Potassium: 3.7 mEq/L (ref 3.5–5.3)
Sodium: 139 mEq/L (ref 135–145)
Total Bilirubin: 0.6 mg/dL (ref 0.3–1.2)
Total Protein: 6.4 g/dL (ref 6.0–8.3)

## 2010-10-12 ENCOUNTER — Encounter (HOSPITAL_COMMUNITY)
Admission: RE | Admit: 2010-10-12 | Discharge: 2010-10-12 | Disposition: A | Discharge status: Home / Self Care | Payer: BC Managed Care – PPO | Source: Ambulatory Visit | Attending: Oncology | Admitting: Oncology

## 2010-10-12 ENCOUNTER — Encounter (HOSPITAL_COMMUNITY): Payer: Self-pay

## 2010-10-12 DIAGNOSIS — C099 Malignant neoplasm of tonsil, unspecified: Secondary | ICD-10-CM

## 2010-10-12 HISTORY — DX: Malignant neoplasm of tonsil, unspecified: C09.9

## 2010-10-12 LAB — GLUCOSE, CAPILLARY: Glucose-Capillary: 85 mg/dL (ref 70–99)

## 2010-10-12 MED ORDER — FLUDEOXYGLUCOSE F - 18 (FDG) INJECTION
15.0000 | Freq: Once | INTRAVENOUS | Status: AC | PRN
  Administered 2010-10-12: 15 via INTRAVENOUS

## 2010-10-13 ENCOUNTER — Other Ambulatory Visit: Payer: Self-pay | Admitting: Oncology

## 2010-10-13 ENCOUNTER — Encounter (HOSPITAL_BASED_OUTPATIENT_CLINIC_OR_DEPARTMENT_OTHER): Payer: BC Managed Care – PPO | Admitting: Oncology

## 2010-10-13 DIAGNOSIS — E441 Mild protein-calorie malnutrition: Secondary | ICD-10-CM

## 2010-10-13 DIAGNOSIS — C099 Malignant neoplasm of tonsil, unspecified: Secondary | ICD-10-CM

## 2010-10-13 DIAGNOSIS — C01 Malignant neoplasm of base of tongue: Secondary | ICD-10-CM

## 2010-10-13 DIAGNOSIS — Z5111 Encounter for antineoplastic chemotherapy: Secondary | ICD-10-CM

## 2010-10-13 DIAGNOSIS — K117 Disturbances of salivary secretion: Secondary | ICD-10-CM

## 2010-10-13 LAB — CBC WITH DIFFERENTIAL/PLATELET
Basophils Absolute: 0 10*3/uL (ref 0.0–0.1)
HCT: 38 % — ABNORMAL LOW (ref 38.4–49.9)
HGB: 13.5 g/dL (ref 13.0–17.1)
LYMPH%: 20.8 % (ref 14.0–49.0)
MONO#: 0.9 10*3/uL (ref 0.1–0.9)
NEUT%: 56.8 % (ref 39.0–75.0)
Platelets: 184 10*3/uL (ref 140–400)
WBC: 4.5 10*3/uL (ref 4.0–10.3)
lymph#: 0.9 10*3/uL (ref 0.9–3.3)

## 2010-10-13 LAB — COMPREHENSIVE METABOLIC PANEL
BUN: 9 mg/dL (ref 6–23)
CO2: 25 mEq/L (ref 19–32)
Calcium: 9.8 mg/dL (ref 8.4–10.5)
Chloride: 101 mEq/L (ref 96–112)
Creatinine, Ser: 1.03 mg/dL (ref 0.50–1.35)
Glucose, Bld: 108 mg/dL — ABNORMAL HIGH (ref 70–99)

## 2010-10-13 LAB — TSH: TSH: 4.449 u[IU]/mL (ref 0.350–4.500)

## 2010-10-15 ENCOUNTER — Other Ambulatory Visit: Payer: Self-pay | Admitting: Oncology

## 2010-10-15 DIAGNOSIS — C099 Malignant neoplasm of tonsil, unspecified: Secondary | ICD-10-CM

## 2010-10-21 ENCOUNTER — Ambulatory Visit (HOSPITAL_COMMUNITY)
Admission: RE | Admit: 2010-10-21 | Discharge: 2010-10-21 | Disposition: A | Discharge status: Home / Self Care | Payer: BC Managed Care – PPO | Source: Ambulatory Visit | Attending: Oncology | Admitting: Oncology

## 2010-10-21 DIAGNOSIS — Z431 Encounter for attention to gastrostomy: Secondary | ICD-10-CM | POA: Insufficient documentation

## 2010-10-21 DIAGNOSIS — C099 Malignant neoplasm of tonsil, unspecified: Secondary | ICD-10-CM

## 2010-10-21 DIAGNOSIS — Z85819 Personal history of malignant neoplasm of unspecified site of lip, oral cavity, and pharynx: Secondary | ICD-10-CM | POA: Insufficient documentation

## 2010-11-05 ENCOUNTER — Ambulatory Visit: Payer: BC Managed Care – PPO | Admitting: Radiation Oncology

## 2011-02-14 ENCOUNTER — Telehealth: Payer: Self-pay | Admitting: Oncology

## 2011-02-14 ENCOUNTER — Other Ambulatory Visit: Payer: Self-pay | Admitting: Oncology

## 2011-02-14 NOTE — Telephone Encounter (Signed)
lmonvm adviisng the pt of his lab,ct scan appt and the appt with the md.

## 2011-04-04 ENCOUNTER — Encounter: Payer: Self-pay | Admitting: *Deleted

## 2011-04-07 ENCOUNTER — Telehealth: Payer: Self-pay | Admitting: *Deleted

## 2011-04-07 NOTE — Telephone Encounter (Signed)
Pt called to ask what type of scan he is having tomorrow?  Informed pt that CT neck is ordered tomorrow and Dr. Gaylyn Rong will give him the results on his appt on Monday 3/18.  Pt verbalized understanding.

## 2011-04-08 ENCOUNTER — Ambulatory Visit (HOSPITAL_COMMUNITY)
Admission: RE | Admit: 2011-04-08 | Discharge: 2011-04-08 | Disposition: A | Discharge status: Home / Self Care | Payer: BC Managed Care – PPO | Source: Ambulatory Visit | Attending: Oncology | Admitting: Oncology

## 2011-04-08 ENCOUNTER — Ambulatory Visit (HOSPITAL_BASED_OUTPATIENT_CLINIC_OR_DEPARTMENT_OTHER): Payer: BC Managed Care – PPO | Admitting: Lab

## 2011-04-08 ENCOUNTER — Encounter: Payer: Self-pay | Admitting: Lab

## 2011-04-08 DIAGNOSIS — E039 Hypothyroidism, unspecified: Secondary | ICD-10-CM

## 2011-04-08 DIAGNOSIS — K117 Disturbances of salivary secretion: Secondary | ICD-10-CM

## 2011-04-08 DIAGNOSIS — R110 Nausea: Secondary | ICD-10-CM

## 2011-04-08 DIAGNOSIS — C109 Malignant neoplasm of oropharynx, unspecified: Secondary | ICD-10-CM

## 2011-04-08 DIAGNOSIS — C099 Malignant neoplasm of tonsil, unspecified: Secondary | ICD-10-CM | POA: Insufficient documentation

## 2011-04-08 DIAGNOSIS — C029 Malignant neoplasm of tongue, unspecified: Secondary | ICD-10-CM | POA: Insufficient documentation

## 2011-04-08 HISTORY — DX: Hypothyroidism, unspecified: E03.9

## 2011-04-08 LAB — CMP (CANCER CENTER ONLY)
AST: 29 U/L (ref 11–38)
Albumin: 4 g/dL (ref 3.3–5.5)
BUN, Bld: 14 mg/dL (ref 7–22)
CO2: 27 mEq/L (ref 18–33)
Calcium: 9.4 mg/dL (ref 8.0–10.3)
Chloride: 97 mEq/L — ABNORMAL LOW (ref 98–108)
Creat: 1.1 mg/dl (ref 0.6–1.2)
Potassium: 4.3 mEq/L (ref 3.3–4.7)

## 2011-04-08 LAB — CBC WITH DIFFERENTIAL/PLATELET
Basophils Absolute: 0 10*3/uL (ref 0.0–0.1)
EOS%: 1.7 % (ref 0.0–7.0)
Eosinophils Absolute: 0.1 10*3/uL (ref 0.0–0.5)
HCT: 45.5 % (ref 38.4–49.9)
HGB: 15.6 g/dL (ref 13.0–17.1)
MONO#: 0.8 10*3/uL (ref 0.1–0.9)
NEUT#: 4.7 10*3/uL (ref 1.5–6.5)
NEUT%: 71.6 % (ref 39.0–75.0)
RDW: 14.3 % (ref 11.0–14.6)
WBC: 6.6 10*3/uL (ref 4.0–10.3)
lymph#: 1 10*3/uL (ref 0.9–3.3)

## 2011-04-08 LAB — TSH: TSH: 5.878 u[IU]/mL — ABNORMAL HIGH (ref 0.350–4.500)

## 2011-04-08 MED ORDER — IOHEXOL 300 MG/ML  SOLN
100.0000 mL | Freq: Once | INTRAMUSCULAR | Status: AC | PRN
  Administered 2011-04-08: 100 mL via INTRAVENOUS

## 2011-04-11 ENCOUNTER — Ambulatory Visit (HOSPITAL_BASED_OUTPATIENT_CLINIC_OR_DEPARTMENT_OTHER): Payer: BC Managed Care – PPO | Admitting: Oncology

## 2011-04-11 ENCOUNTER — Other Ambulatory Visit: Payer: Self-pay | Admitting: Oncology

## 2011-04-11 VITALS — BP 135/85 | HR 81 | Temp 98.7°F | Ht 67.0 in | Wt 135.7 lb

## 2011-04-11 DIAGNOSIS — E039 Hypothyroidism, unspecified: Secondary | ICD-10-CM

## 2011-04-11 DIAGNOSIS — C109 Malignant neoplasm of oropharynx, unspecified: Secondary | ICD-10-CM

## 2011-04-11 DIAGNOSIS — C01 Malignant neoplasm of base of tongue: Secondary | ICD-10-CM

## 2011-04-11 DIAGNOSIS — B977 Papillomavirus as the cause of diseases classified elsewhere: Secondary | ICD-10-CM

## 2011-04-11 DIAGNOSIS — C099 Malignant neoplasm of tonsil, unspecified: Secondary | ICD-10-CM

## 2011-04-11 MED ORDER — LEVOTHYROXINE SODIUM 50 MCG PO TABS: 50.0000 ug | ORAL_TABLET | Freq: Every day | ORAL | Status: DC

## 2011-04-11 NOTE — Progress Notes (Signed)
[facility] OFFICE PROGRESS NOTE  Cc:  Mark Moh, MD, MD  DIAGNOSIS:  1. 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 2. 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  PAST THERAPY:  Concurrent weekly cisplatin along with daily radiation therapy from 05/24/2010 to 07/08/2010.  CURRENT THERAPY:  watchful observation.  INTERVAL HISTORY: Mark Mendoza 61 y.o. male returns for regular follow up by himself.  He reports doing well.  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  He denies smoking or chewing tobacco.  He drinks about 1-2 beers every few days. He still has xerostomia and abnormal taste buds.   He recently noticed thickening of his submental edema.  He denies odynophagia, dysphagia, neck node swelling.   He denies skin/hair changes/ cold intolerance/ constipation.  Patient denies fatigue, headache, visual changes, confusion, drenching night sweats, palpable lymph node swelling, mucositis, odynophagia, dysphagia, nausea vomiting, jaundice, chest pain, palpitation, shortness of breath, dyspnea on exertion, productive cough, gum bleeding, epistaxis, hematemesis, hemoptysis, abdominal pain, abdominal swelling, early satiety, melena, hematochezia, hematuria, skin rash, spontaneous bleeding, joint swelling, joint pain, heat or cold intolerance, bowel bladder incontinence, back pain, focal motor weakness, paresthesia, depression, suicidal or homocidal ideation, feeling hopelessness.   Past Medical History  Diagnosis Date  . Cancer   . Tonsillar cancer   . Hypothyroid 04/08/2011    No past surgical history on file.  Current Outpatient Prescriptions  Medication Sig Dispense Refill  . amLODipine-benazepril (LOTREL) 10-40 MG per capsule Take 1 capsule by mouth daily.      [name] aspirin 325 MG tablet Take 325 mg by mouth  daily.      [name] atorvastatin (LIPITOR) 40 MG tablet Take 40 mg by mouth daily.      . niacin (NIASPAN) 500 MG CR tablet Take 500 mg by mouth at bedtime.      [name] guaifenesin (ROBITUSSIN) 100 MG/5ML syrup Swish and spit 100 mg every 6 (six) hours as needed.      . hydrochlorothiazide (HYDRODIURIL) 12.5 MG tablet Take 12.5 mg by mouth daily.      [name] HYDROcodone-acetaminophen (NORCO) 5-325 MG per tablet Take 1 tablet by mouth every 6 (six) hours as needed.      [name] LORazepam (ATIVAN) 0.5 MG tablet Take 0.5 mg by mouth every 12 (twelve) hours as needed.      . ondansetron (ZOFRAN) 8 MG tablet Take by mouth every 12 (twelve) hours as needed.      . prochlorperazine (COMPAZINE) 10 MG tablet Take 10 mg by mouth every 6 (six) hours as needed.        ALLERGIES:  is allergic to codeine.  REVIEW OF SYSTEMS:  The rest of the 14-point review of system was negative.   Filed Vitals:   04/11/11 1434  BP: 135/85  Pulse: 81  Temp: 98.7 F (37.1 C)   Wt Readings from Last 3 Encounters:  04/11/11 135 lb 11.2 oz (61.553 kg)  10/06/10 138 lb 6.4 oz (62.778 kg)   ECOG Performance status: 0  PHYSICAL EXAMINATION:   General:  well-nourished in no acute distress.  Eyes:  no scleral icterus.  ENT:  There were no oropharyngeal lesions.  Neck was without thyromegaly.  There was submental edema without palpable mass. Lymphatics:  Negative cervical, supraclavicular or axillary adenopathy.  Respiratory: lungs were clear bilaterally without wheezing or crackles.  Cardiovascular:  Regular rate and rhythm, S1/S2, without murmur, rub or gallop.  There was no pedal edema.  GI:  abdomen was soft, flat, nontender, nondistended, without organomegaly.  Muscoloskeletal:  no spinal tenderness of palpation of vertebral spine.  Skin exam was without echymosis, petichae.  Neuro exam was nonfocal.  Patient was able to get on and off exam table without assistance.  Gait was normal.  Patient was alerted and oriented.  Attention was good.    Language was appropriate.  Mood was normal without depression.  Speech was not pressured.  Thought content was not tangential.      LABORATORY/RADIOLOGY DATA:  Lab Results  Component Value Date   WBC 6.6 04/08/2011   HGB 15.6 04/08/2011   HCT 45.5 04/08/2011   PLT 220 04/08/2011   GLUCOSE 100 04/08/2011   ALT 12 10/13/2010   ALT 12 10/13/2010   AST 29 04/08/2011   NA 140 04/08/2011   K 4.3 04/08/2011   CL 97* 04/08/2011   CREATININE 1.1 04/08/2011   BUN 14 04/08/2011   CO2 27 04/08/2011   TSH 5.878* 04/08/2011   IMAGING:  I personally reviewed the following CT neck and showed the images to the patient.  In brief, there was no evidence of recurrent cancer.    Ct Soft Tissue Neck W Contrast  04/08/2011  *RADIOLOGY REPORT*  Clinical Data: Follow-up carcinoma of the left tonsil and tongue  CT NECK WITH CONTRAST  Technique:  Multidetector CT imaging of the neck was performed with intravenous contrast.  Contrast: OMNIPAQUE IOHEXOL 300 MG/ML IJ SOLN  Comparison: PET CT 10/12/2010.  CT 04/09/2010  Findings: Lung apices show bilateral pleural and parenchymal scarring with mild emphysematous change.  This has worsened presumably relates to radiation.  Limited visualization of the intracranial contents does not show any significant lesion.  Parotid and submandibular glands are unremarkable.  No thyroid lesion.  No evidence of residual or recurrent tonsillar or tongue mass. There is some mild edema in the hypopharyngeal region consistent with radiation.  There are no pathologically enlarged lymph nodes on either side of the neck.  Previously seen enlarged lymph nodes have resolved.  Vascular structures are unremarkable.  Nonocclusive atherosclerotic change of the carotid arteries.  No significant bony finding.  IMPRESSION: No evidence of residual or recurrent disease.  Post radiation change of the hypopharyngeal region.  Original Report Authenticated By: Mark Mendoza, M.D.    ASSESSMENT AND  PLAN:   1. History of oropharynx squamous cell carcinoma:  I discussed with Mr. Mark Mendoza that there is no evidence of residual distant metastatic disease including clinical history, physical exam, laboratory tests, and imaging modality.  I will present him at tumor board to review the CT neck with radiology.  I advised him to completely refrain from smoking cigarettes or taking alcohol to decrease the risk of recurrence.  He still drinks a few beers a week.  I advised him to refrain from EtOH as much as possible.  2. Calorie protein malnutrition:  Stable weight.  He still has xerostomia and abnormal taste; however, he has no food restriction.  3. Hypertension:  He is on amlodipine and hydrochlorothiazide per PCP.  4. Hypercholesterolemia.  He is on simvastatin per PCP.  5. Hypothyroidism:  Due to radiation.  I started him today on Synthroid PO daily.  I advised him to watch out for palpitation, diarrhea and other signs of hyperthyroidism.  I will check his TSH here at the Cancer Center in  2 and 4 months.  6. Colon cancer screening within the last 2 years with Dr. Carolee Rota colleagues in [facility].  He said it was negative and therefore the next one is due in about 2015, unless he has any symptoms requiring evaluation sooner.   7. Submental edema: 2/2 treatment for HNSCC.  I referred him to Lymphedema clinic for edema massaging exercises.  8. Follow up with Clenton Pare, N.P. in the clinic in about 6 months with day before CT of the neck and with me in about 1 year.   I advised him to follow up with Dr. Pollyann Kennedy and Dr. Mitzi Hansen in the interval as well.

## 2011-04-11 NOTE — Patient Instructions (Addendum)
1.  Result of CT:    Ct Soft Tissue Neck W Contrast  04/08/2011  *RADIOLOGY REPORT*  Clinical Data: Follow-up carcinoma of the left tonsil and tongue  CT NECK WITH CONTRAST  Technique:  Multidetector CT imaging of the neck was performed with intravenous contrast.  Contrast: OMNIPAQUE IOHEXOL 300 MG/ML IJ SOLN  Comparison: PET CT 10/12/2010.  CT 04/09/2010  Findings: Lung apices show bilateral pleural and parenchymal scarring with mild emphysematous change.  This has worsened presumably relates to radiation.  Limited visualization of the intracranial contents does not show any significant lesion.  Parotid and submandibular glands are unremarkable.  No thyroid lesion.  No evidence of residual or recurrent tonsillar or tongue mass. There is some mild edema in the hypopharyngeal region consistent with radiation.  There are no pathologically enlarged lymph nodes on either side of the neck.  Previously seen enlarged lymph nodes have resolved.  Vascular structures are unremarkable.  Nonocclusive atherosclerotic change of the carotid arteries.  No significant bony finding.  IMPRESSION: No evidence of residual or recurrent disease.  Post radiation change of the hypopharyngeal region.    2.  Hypothyroidism.  Common side effect of radiation.  If untreated, hypothyroidism can cause fatigue, weight gain, and other symptoms.  -Start Synthroid 50mg  by mouth once a day. -Return to Cancer Center in 2 and 4 months to check to make sure that the Synthroid dose is adequate.   3. Follow up:  With ENT and Rad Onc.  Return visit with Dr. Lodema Pilot Nurse Practitioner in 6 months and with Dr. Gaylyn Rong in about 1 year the day after CT neck.    4.  Abstain from smoking, chewing tobacco, or drinking alcohol to decrease the risk of recurrence of head/neck cancer

## 2011-04-13 ENCOUNTER — Encounter: Payer: Self-pay | Admitting: *Deleted

## 2011-04-13 NOTE — Progress Notes (Signed)
Faxed Referral to Lymphedema Clinic at [facility] for submental edema per Dr. Gaylyn Rong.  Fax [phone].

## 2011-08-01 ENCOUNTER — Telehealth: Payer: Self-pay | Admitting: Oncology

## 2011-08-01 ENCOUNTER — Other Ambulatory Visit: Payer: Self-pay | Admitting: *Deleted

## 2011-08-01 NOTE — Telephone Encounter (Signed)
Pt called today wanting to schedule f/u. I did find ct order for sept 2013 but no pof found. lmonvm for desk nurse asking for confirmation of what pt needs and a pof. Pt aware he will be contacted w/appts when orders have been confirmed.

## 2011-08-05 ENCOUNTER — Telehealth: Payer: Self-pay | Admitting: Oncology

## 2011-08-05 NOTE — Telephone Encounter (Signed)
lmonvm for pt re appts for lb/ct 9/18 and [facility] 9/20 and mailed schedule.

## 2011-10-12 ENCOUNTER — Ambulatory Visit (HOSPITAL_COMMUNITY)
Admission: RE | Admit: 2011-10-12 | Discharge: 2011-10-12 | Disposition: A | Discharge status: Home / Self Care | Payer: BC Managed Care – PPO | Source: Ambulatory Visit | Attending: Oncology | Admitting: Oncology

## 2011-10-12 ENCOUNTER — Other Ambulatory Visit (HOSPITAL_BASED_OUTPATIENT_CLINIC_OR_DEPARTMENT_OTHER): Payer: BC Managed Care – PPO

## 2011-10-12 DIAGNOSIS — E039 Hypothyroidism, unspecified: Secondary | ICD-10-CM

## 2011-10-12 DIAGNOSIS — Z9221 Personal history of antineoplastic chemotherapy: Secondary | ICD-10-CM | POA: Insufficient documentation

## 2011-10-12 DIAGNOSIS — C099 Malignant neoplasm of tonsil, unspecified: Secondary | ICD-10-CM | POA: Insufficient documentation

## 2011-10-12 DIAGNOSIS — C029 Malignant neoplasm of tongue, unspecified: Secondary | ICD-10-CM | POA: Insufficient documentation

## 2011-10-12 DIAGNOSIS — C109 Malignant neoplasm of oropharynx, unspecified: Secondary | ICD-10-CM

## 2011-10-12 DIAGNOSIS — Z923 Personal history of irradiation: Secondary | ICD-10-CM | POA: Insufficient documentation

## 2011-10-12 LAB — CBC WITH DIFFERENTIAL/PLATELET
Basophils Absolute: 0 10*3/uL (ref 0.0–0.1)
Eosinophils Absolute: 0.3 10*3/uL (ref 0.0–0.5)
HCT: 40.1 % (ref 38.4–49.9)
HGB: 14.1 g/dL (ref 13.0–17.1)
MCV: 98 fL (ref 79.3–98.0)
NEUT#: 2.9 10*3/uL (ref 1.5–6.5)
NEUT%: 58.4 % (ref 39.0–75.0)
RDW: 13.3 % (ref 11.0–14.6)
lymph#: 1 10*3/uL (ref 0.9–3.3)

## 2011-10-12 LAB — COMPREHENSIVE METABOLIC PANEL (CC13)
Albumin: 3.7 g/dL (ref 3.5–5.0)
CO2: 26 mEq/L (ref 22–29)
Calcium: 9.7 mg/dL (ref 8.4–10.4)
Glucose: 97 mg/dl (ref 70–99)
Potassium: 4.5 mEq/L (ref 3.5–5.1)
Sodium: 131 mEq/L — ABNORMAL LOW (ref 136–145)
Total Protein: 6.8 g/dL (ref 6.4–8.3)

## 2011-10-12 MED ORDER — IOHEXOL 300 MG/ML  SOLN
100.0000 mL | Freq: Once | INTRAMUSCULAR | Status: AC | PRN
  Administered 2011-10-12: 100 mL via INTRAVENOUS

## 2011-10-14 ENCOUNTER — Ambulatory Visit (HOSPITAL_BASED_OUTPATIENT_CLINIC_OR_DEPARTMENT_OTHER): Payer: BC Managed Care – PPO | Admitting: Oncology

## 2011-10-14 ENCOUNTER — Encounter: Payer: Self-pay | Admitting: Oncology

## 2011-10-14 ENCOUNTER — Telehealth: Payer: Self-pay | Admitting: Oncology

## 2011-10-14 VITALS — BP 143/83 | HR 90 | Temp 97.3°F | Resp 20 | Wt 143.5 lb

## 2011-10-14 DIAGNOSIS — E039 Hypothyroidism, unspecified: Secondary | ICD-10-CM

## 2011-10-14 DIAGNOSIS — C099 Malignant neoplasm of tonsil, unspecified: Secondary | ICD-10-CM

## 2011-10-14 DIAGNOSIS — I10 Essential (primary) hypertension: Secondary | ICD-10-CM

## 2011-10-14 DIAGNOSIS — E890 Postprocedural hypothyroidism: Secondary | ICD-10-CM

## 2011-10-14 DIAGNOSIS — C01 Malignant neoplasm of base of tongue: Secondary | ICD-10-CM

## 2011-10-14 NOTE — Progress Notes (Signed)
[facility] OFFICE PROGRESS NOTE  Cc:  Mark Moh, MD  DIAGNOSIS:  1. 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 2. 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  PAST THERAPY:  Concurrent weekly cisplatin along with daily radiation therapy from 05/24/2010 to 07/08/2010.  CURRENT THERAPY:  watchful observation.  INTERVAL HISTORY: Mark Mendoza 61 y.o. male returns for regular follow up by himself.  He reports doing well.  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  He denies smoking or chewing tobacco.  He drinks about 2 beers per week. He still has xerostomia and abnormal taste buds. Appetite is better and he is gaining weight. He denies odynophagia, dysphagia, neck node swelling.   He denies skin/hair changes/ cold intolerance/ constipation.  Patient denies fatigue, headache, visual changes, confusion, drenching night sweats, palpable lymph node swelling, mucositis, odynophagia, dysphagia, nausea vomiting, jaundice, chest pain, palpitation, shortness of breath, dyspnea on exertion, productive cough, gum bleeding, epistaxis, hematemesis, hemoptysis, abdominal pain, abdominal swelling, early satiety, melena, hematochezia, hematuria, skin rash, spontaneous bleeding, joint swelling, joint pain, heat or cold intolerance, bowel bladder incontinence, back pain, focal motor weakness, paresthesia, depression, suicidal or homocidal ideation, feeling hopelessness.   Past Medical History  Diagnosis Date  . Cancer   . Tonsillar cancer   . Hypothyroid 04/08/2011    History reviewed. No pertinent past surgical history.  Current Outpatient Prescriptions  Medication Sig Dispense Refill  . amLODipine (NORVASC) 5 MG tablet Take 5 mg by mouth Daily.      [name] aspirin 325 MG tablet Take 325 mg by mouth daily.      [name] atorvastatin  (LIPITOR) 40 MG tablet Take 40 mg by mouth daily.      . folic acid (FOLVITE) 400 MCG tablet Take 400 mcg by mouth daily.      . hydrochlorothiazide (HYDRODIURIL) 12.5 MG tablet Take 12.5 mg by mouth daily.      [name] levothyroxine (SYNTHROID) 50 MCG tablet Take 1 tablet (50 mcg total) by mouth daily.  30 tablet  6  . lisinopril (PRINIVIL,ZESTRIL) 40 MG tablet Take 40 mg by mouth Daily.      . mirtazapine (REMERON) 15 MG tablet Take 15 mg by mouth at bedtime.      [name] NEXIUM 40 MG capsule Take 40 mg by mouth Daily.      . niacin (NIASPAN) 500 MG CR tablet Take 500 mg by mouth at bedtime.      . vitamin B-12 (CYANOCOBALAMIN) 1000 MCG tablet Take 1,000 mcg by mouth daily.        ALLERGIES:  is allergic to codeine.  REVIEW OF SYSTEMS:  The rest of the 14-point review of system was negative.   Filed Vitals:   10/14/11 1626  BP: 143/83  Pulse: 90  Temp: 97.3 F (36.3 C)  Resp: 20   Wt Readings from Last 3 Encounters:  10/14/11 143 lb 8 oz (65.091 kg)  04/11/11 135 lb 11.2 oz (61.553 kg)  10/06/10 138 lb 6.4 oz (62.778 kg)   ECOG Performance status: 0  PHYSICAL EXAMINATION:   General:  well-nourished in no acute distress.  Eyes:  no scleral icterus.  ENT:  There were no oropharyngeal lesions.  Neck was without thyromegaly.  There was submental edema without palpable mass. Lymphatics:  Negative cervical, supraclavicular or axillary adenopathy.  Respiratory: lungs were clear  bilaterally without wheezing or crackles.  Cardiovascular:  Regular rate and rhythm, S1/S2, without murmur, rub or gallop.  There was no pedal edema.  GI:  abdomen was soft, flat, nontender, nondistended, without organomegaly.  Muscoloskeletal:  no spinal tenderness of palpation of vertebral spine.  Skin exam was without echymosis, petichae.  Neuro exam was nonfocal.  Patient was able to get on and off exam table without assistance.  Gait was normal.  Patient was alerted and oriented.  Attention was good.   Language was  appropriate.  Mood was normal without depression.  Speech was not pressured.  Thought content was not tangential.    LABORATORY/RADIOLOGY DATA:  Lab Results  Component Value Date   WBC 4.9 10/12/2011   HGB 14.1 10/12/2011   HCT 40.1 10/12/2011   PLT 204 10/12/2011   GLUCOSE 97 10/12/2011   ALT 19 10/12/2011   AST 25 10/12/2011   NA 131* 10/12/2011   K 4.5 10/12/2011   CL 95* 10/12/2011   CREATININE 1.0 10/12/2011   BUN 12.0 10/12/2011   CO2 26 10/12/2011   TSH 4.053 10/12/2011   IMAGING:    *RADIOLOGY REPORT*  Clinical Data: 61 year old male completed chemotherapy and  radiation for left tonsil and tongue carcinoma diagnosed in 2012.  CT NECK WITH CONTRAST  Technique: Multidetector CT imaging of the neck was performed with  intravenous contrast.  Contrast: OMNIPAQUE IOHEXOL 300 MG/ML SOLN  Comparison: Post treatment study 04/08/2011 and earlier.  Findings: Nasopharynx is stable within normal limits. Tonsillar  pillars are stable with post therapy soft tissue thickening, no  asymmetry or discrete hyperenhancing. Soft tissue thickening  continues to the epiglottis and hypopharynx. The vallecula remains  largely effaced. The level of the glottis is within normal limits.  Trace retropharyngeal effusion persists. Parapharyngeal and  sublingual spaces are within normal limits. Post-treatment changes  to the parotid and submandibular glands. No residual left level II  lymphadenopathy. No new or increased lymph nodes. Negative  thyroid and visualized superior mediastinum.  Visualized orbit soft tissues are within normal limits. Stable and  negative visualized brain parenchyma. Major vascular structures  remain patent with scattered atherosclerosis. Sequelae of median  sternotomy. No acute osseous abnormality identified. Visualized  paranasal sinuses and mastoids are clear. Stable apical lung  changes.  IMPRESSION:  Stable and satisfactory post-therapy appearance of the neck. No  new  abnormality.  Original Report Authenticated By: Mark Mendoza, M.D.   ASSESSMENT AND PLAN:   1. History of oropharynx squamous cell carcinoma:  I discussed with Mark Mendoza that there is no evidence of residual distant metastatic disease including clinical history, physical exam, laboratory tests, and imaging modality. I advised him to completely refrain from smoking cigarettes or taking alcohol to decrease the risk of recurrence.  He still drinks a few beers a week.  I advised him to refrain from EtOH as much as possible. Plan is for another surveillance CT in 6 months. It has been about 6 months since he has seen ENT and I have asked him to schedule a follow-up. 2. Calorie protein malnutrition:  Stable weight.  He still has xerostomia and abnormal taste; however, he has no food restriction.  3. Hypertension:  He is on amlodipine, lisinopril, and hydrochlorothiazide per PCP.  4. Hypercholesterolemia.  He is on atorvastatin per PCP.  5. Hypothyroidism:  Due to radiation. On Synthroid 50 mcg daily. TSH is normal today. Recommend that he continue Synthroid at the current dose. TSH in 3 and 6  months.  6. Colon cancer screening within the last 2 years with Dr. Carolee Rota colleagues in [facility].  He said it was negative and therefore the next one is due in about 2015, unless he has any symptoms requiring evaluation sooner.    7. Follow up: In 6 months with a CT of the neck and visit.  I advised him to follow up with Dr. Pollyann Kennedy and Dr. Mitzi Hansen in the interval as well.

## 2011-10-14 NOTE — Telephone Encounter (Signed)
gve the pt his dec,march 2014 appt calendar along with the ct scan appt

## 2011-10-14 NOTE — Patient Instructions (Addendum)
*  RADIOLOGY REPORT*  Clinical Data: 61 year old male completed chemotherapy and  radiation for left tonsil and tongue carcinoma diagnosed in 2012.  CT NECK WITH CONTRAST  Technique: Multidetector CT imaging of the neck was performed with  intravenous contrast.  Contrast: OMNIPAQUE IOHEXOL 300 MG/ML SOLN  Comparison: Post treatment study 04/08/2011 and earlier.  Findings: Nasopharynx is stable within normal limits. Tonsillar  pillars are stable with post therapy soft tissue thickening, no  asymmetry or discrete hyperenhancing. Soft tissue thickening  continues to the epiglottis and hypopharynx. The vallecula remains  largely effaced. The level of the glottis is within normal limits.  Trace retropharyngeal effusion persists. Parapharyngeal and  sublingual spaces are within normal limits. Post-treatment changes  to the parotid and submandibular glands. No residual left level II  lymphadenopathy. No new or increased lymph nodes. Negative  thyroid and visualized superior mediastinum.  Visualized orbit soft tissues are within normal limits. Stable and  negative visualized brain parenchyma. Major vascular structures  remain patent with scattered atherosclerosis. Sequelae of median  sternotomy. No acute osseous abnormality identified. Visualized  paranasal sinuses and mastoids are clear. Stable apical lung  changes.  IMPRESSION:  Stable and satisfactory post-therapy appearance of the neck. No  new abnormality.

## 2011-11-03 ENCOUNTER — Other Ambulatory Visit: Payer: Self-pay | Admitting: *Deleted

## 2011-11-03 ENCOUNTER — Other Ambulatory Visit: Payer: Self-pay | Admitting: Oncology

## 2011-11-03 DIAGNOSIS — E039 Hypothyroidism, unspecified: Secondary | ICD-10-CM

## 2011-11-03 DIAGNOSIS — C109 Malignant neoplasm of oropharynx, unspecified: Secondary | ICD-10-CM

## 2011-11-03 MED ORDER — LEVOTHYROXINE SODIUM 50 MCG PO TABS: 50.0000 ug | ORAL_TABLET | Freq: Every day | ORAL | Status: DC

## 2012-01-12 ENCOUNTER — Telehealth: Payer: Self-pay | Admitting: Oncology

## 2012-01-12 NOTE — Telephone Encounter (Signed)
pt call and needed his lab for 12.23.13..Marland KitchenMarland KitchenDone

## 2012-01-13 ENCOUNTER — Other Ambulatory Visit: Payer: Self-pay | Admitting: Lab

## 2012-01-16 ENCOUNTER — Other Ambulatory Visit: Payer: BC Managed Care – PPO | Admitting: Lab

## 2012-01-16 DIAGNOSIS — E039 Hypothyroidism, unspecified: Secondary | ICD-10-CM

## 2012-01-16 DIAGNOSIS — C099 Malignant neoplasm of tonsil, unspecified: Secondary | ICD-10-CM

## 2012-01-16 LAB — TSH: TSH: 4.202 u[IU]/mL (ref 0.350–4.500)

## 2012-01-19 ENCOUNTER — Telehealth: Payer: Self-pay

## 2012-01-19 NOTE — Telephone Encounter (Signed)
Message copied by Kallie Locks on Thu Jan 19, 2012  2:48 PM ------      Message from: Clenton Pare R      Created: Mon Jan 16, 2012  8:46 PM       Please call pt. TSH is normal. Continue current dose of Synthroid.

## 2012-04-11 ENCOUNTER — Other Ambulatory Visit (HOSPITAL_BASED_OUTPATIENT_CLINIC_OR_DEPARTMENT_OTHER): Payer: BC Managed Care – PPO | Admitting: Lab

## 2012-04-11 ENCOUNTER — Ambulatory Visit (HOSPITAL_COMMUNITY)
Admission: RE | Admit: 2012-04-11 | Discharge: 2012-04-11 | Disposition: A | Discharge status: Home / Self Care | Payer: BC Managed Care – PPO | Source: Ambulatory Visit | Attending: Oncology | Admitting: Oncology

## 2012-04-11 ENCOUNTER — Encounter (HOSPITAL_COMMUNITY): Payer: Self-pay

## 2012-04-11 DIAGNOSIS — E039 Hypothyroidism, unspecified: Secondary | ICD-10-CM | POA: Insufficient documentation

## 2012-04-11 DIAGNOSIS — C109 Malignant neoplasm of oropharynx, unspecified: Secondary | ICD-10-CM

## 2012-04-11 DIAGNOSIS — C029 Malignant neoplasm of tongue, unspecified: Secondary | ICD-10-CM | POA: Insufficient documentation

## 2012-04-11 DIAGNOSIS — C099 Malignant neoplasm of tonsil, unspecified: Secondary | ICD-10-CM | POA: Insufficient documentation

## 2012-04-11 DIAGNOSIS — J984 Other disorders of lung: Secondary | ICD-10-CM | POA: Insufficient documentation

## 2012-04-11 HISTORY — DX: Essential (primary) hypertension: I10

## 2012-04-11 LAB — COMPREHENSIVE METABOLIC PANEL (CC13)
AST: 23 U/L (ref 5–34)
Alkaline Phosphatase: 105 U/L (ref 40–150)
BUN: 15.1 mg/dL (ref 7.0–26.0)
Glucose: 139 mg/dl — ABNORMAL HIGH (ref 70–99)
Sodium: 136 mEq/L (ref 136–145)
Total Bilirubin: 0.38 mg/dL (ref 0.20–1.20)
Total Protein: 7.5 g/dL (ref 6.4–8.3)

## 2012-04-11 LAB — CBC WITH DIFFERENTIAL/PLATELET
BASO%: 0.4 % (ref 0.0–2.0)
Basophils Absolute: 0 10*3/uL (ref 0.0–0.1)
EOS%: 2.7 % (ref 0.0–7.0)
HGB: 15.5 g/dL (ref 13.0–17.1)
MCH: 33.8 pg — ABNORMAL HIGH (ref 27.2–33.4)
MCHC: 34.7 g/dL (ref 32.0–36.0)
MCV: 97.3 fL (ref 79.3–98.0)
MONO%: 15.4 % — ABNORMAL HIGH (ref 0.0–14.0)
RDW: 13.2 % (ref 11.0–14.6)
lymph#: 1.2 10*3/uL (ref 0.9–3.3)

## 2012-04-11 MED ORDER — IOHEXOL 300 MG/ML  SOLN
100.0000 mL | Freq: Once | INTRAMUSCULAR | Status: AC | PRN
  Administered 2012-04-11: 100 mL via INTRAVENOUS

## 2012-04-13 ENCOUNTER — Ambulatory Visit (HOSPITAL_BASED_OUTPATIENT_CLINIC_OR_DEPARTMENT_OTHER): Payer: BC Managed Care – PPO | Admitting: Oncology

## 2012-04-13 VITALS — BP 153/93 | HR 96 | Temp 98.1°F | Resp 20 | Ht 67.0 in | Wt 141.7 lb

## 2012-04-13 DIAGNOSIS — E039 Hypothyroidism, unspecified: Secondary | ICD-10-CM

## 2012-04-13 DIAGNOSIS — C099 Malignant neoplasm of tonsil, unspecified: Secondary | ICD-10-CM

## 2012-04-13 NOTE — Progress Notes (Signed)
[facility] OFFICE PROGRESS NOTE  Cc:  Londell Moh, MD  DIAGNOSIS:  1. 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 2. 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  PAST THERAPY:  Concurrent weekly cisplatin along with daily radiation therapy from 05/24/2010 to 07/08/2010.  CURRENT THERAPY:  watchful observation.  INTERVAL HISTORY: Mark Mendoza 62 y.o. male returns for regular follow up by himself.  He reports doing well.  He is still working as a Surveyor, minerals. He denies dysphasia, mucositis, odynophagia, lymph node swelling, shortness of breath, dyspnea on exertion, bone pain. The rest of the 14 point review of system was negative.   Past Medical History  Diagnosis Date  . Hypothyroid 04/08/2011  . Cancer   . Tonsillar cancer dx'd 04/2010  . Hypertension     No past surgical history on file.  Current Outpatient Prescriptions  Medication Sig Dispense Refill  . amLODipine (NORVASC) 5 MG tablet Take 5 mg by mouth Daily.      [name] aspirin 325 MG tablet Take 325 mg by mouth daily.      [name] atorvastatin (LIPITOR) 40 MG tablet Take 40 mg by mouth daily.      . folic acid (FOLVITE) 400 MCG tablet Take 400 mcg by mouth daily.      . hydrochlorothiazide (HYDRODIURIL) 12.5 MG tablet Take 12.5 mg by mouth daily.      [name] levothyroxine (SYNTHROID) 50 MCG tablet Take 1 tablet (50 mcg total) by mouth daily.  90 tablet  3  . lisinopril (PRINIVIL,ZESTRIL) 40 MG tablet Take 40 mg by mouth Daily.      . mirtazapine (REMERON) 15 MG tablet Take 15 mg by mouth at bedtime.      [name] NEXIUM 40 MG capsule Take 40 mg by mouth Daily.      . niacin (NIASPAN) 500 MG CR tablet Take 500 mg by mouth at bedtime.      . vitamin B-12 (CYANOCOBALAMIN) 1000 MCG tablet Take 1,000 mcg by mouth daily.       No current facility-administered  medications for this visit.    ALLERGIES:  is allergic to codeine.  REVIEW OF SYSTEMS:  The rest of the 14-point review of system was negative.   Filed Vitals:   04/13/12 1324  BP: 153/93  Pulse: 96  Temp: 98.1 F (36.7 C)  Resp: 20   Wt Readings from Last 3 Encounters:  04/13/12 141 lb 11.2 oz (64.275 kg)  10/14/11 143 lb 8 oz (65.091 kg)  04/11/11 135 lb 11.2 oz (61.553 kg)   ECOG Performance status: 0  PHYSICAL EXAMINATION:   General:  well-nourished in no acute distress.  Eyes:  no scleral icterus.  ENT:  There were no oropharyngeal lesions.  Neck was without thyromegaly.  There was submental edema without palpable mass. Lymphatics:  Negative cervical, supraclavicular or axillary adenopathy.  Respiratory: lungs were clear bilaterally without wheezing or crackles.  Cardiovascular:  Regular rate and rhythm, S1/S2, without murmur, rub or gallop.  There was no pedal edema.  GI:  abdomen was soft, flat, nontender, nondistended, without organomegaly.  Muscoloskeletal:  no spinal tenderness of palpation of vertebral spine.  Skin exam was without echymosis, petichae.  Neuro exam was nonfocal.  Patient was able to get on and off exam table without assistance.  Gait was normal.  Patient was alerted  and oriented.  Attention was good.   Language was appropriate.  Mood was normal without depression.  Speech was not pressured.  Thought content was not tangential.      LABORATORY/RADIOLOGY DATA:  Lab Results  Component Value Date   WBC 7.5 04/11/2012   HGB 15.5 04/11/2012   HCT 44.6 04/11/2012   PLT 232 04/11/2012   GLUCOSE 139* 04/11/2012   ALT 14 04/11/2012   AST 23 04/11/2012   NA 136 04/11/2012   K 4.4 04/11/2012   CL 100 04/11/2012   CREATININE 1.1 04/11/2012   BUN 15.1 04/11/2012   CO2 28 04/11/2012   TSH 4.358 04/11/2012   IMAGING:  I personally reviewed the following CT neck and showed the images to the patient.  In brief, there was no evidence of recurrent cancer.   CT NECK WITH  CONTRAST  Technique: Multidetector CT imaging of the neck was performed with  intravenous contrast.  Contrast: OMNIPAQUE IOHEXOL 300 MG/ML SOLN  Comparison: CT neck 10/12/2011  Findings: Postradiation changes are again noted with edema  throughout the epiglottis and hypopharynx. Edema extends into the  false cord on the left which is more prominent than on the prior  study however the glottis was closed previously. These findings  are most compatible with postradiation edema. Postradiation  changes are noted in the salivary glands which show mild  hyperenhancement. There is thickening of the platysmus and  subcutaneous tissues from radiation.  Negative for recurrent mass lesion. No adenopathy.  Air-fluid level left sphenoid sinus. Mucosal edema in the maxillary  sinus bilaterally. Air-fluid level left maxillary sinus.  Scarring in the lung apices may be due to radiation. No acute bony  change.   IMPRESSION:  Stable postradiation changes in the neck. No recurrent mass or  adenopathy.    ASSESSMENT AND PLAN:   1. History of oropharynx squamous cell carcinoma:   he is still in remission. As he is more than a year out from the finish of therapy, and he does not have any focal symptoms, I recommended clinical exam only and no routine surveillance CT scan  unless he has any concerning symptoms.  He expressed informed understanding and agreed with watchful observation. I advised him to follow with ENT for at least once a year laryngoscopy   2. Hypertension:  He is on amlodipine, lisinopril,  and hydrochlorothiazide per PCP.  3. Hypercholesterolemia.  He is on simvastatin per PCP.  4. Hypothyroidism:   he is on Synthroid. His TSH is within normal range. I recommend continuing current dose of Synthroid.  5. Follow up in about one year but sooner if concerning symptoms.

## 2012-04-16 ENCOUNTER — Telehealth: Payer: Self-pay | Admitting: Oncology

## 2012-04-16 NOTE — Telephone Encounter (Signed)
lvm for pt regarding to Nov appt.Mark KitchenMarland KitchenMarland Mendoza

## 2012-08-14 ENCOUNTER — Ambulatory Visit (INDEPENDENT_AMBULATORY_CARE_PROVIDER_SITE_OTHER): Payer: BC Managed Care – PPO | Admitting: Cardiology

## 2012-08-14 ENCOUNTER — Encounter: Payer: Self-pay | Admitting: Cardiology

## 2012-08-14 VITALS — BP 136/76 | HR 87 | Ht 67.0 in | Wt 152.2 lb

## 2012-08-14 DIAGNOSIS — E785 Hyperlipidemia, unspecified: Secondary | ICD-10-CM

## 2012-08-14 DIAGNOSIS — Z9861 Coronary angioplasty status: Secondary | ICD-10-CM

## 2012-08-14 DIAGNOSIS — I10 Essential (primary) hypertension: Secondary | ICD-10-CM

## 2012-08-14 DIAGNOSIS — I255 Ischemic cardiomyopathy: Secondary | ICD-10-CM

## 2012-08-14 DIAGNOSIS — I2510 Atherosclerotic heart disease of native coronary artery without angina pectoris: Secondary | ICD-10-CM

## 2012-08-14 DIAGNOSIS — I2589 Other forms of chronic ischemic heart disease: Secondary | ICD-10-CM

## 2012-08-14 NOTE — Patient Instructions (Addendum)
You are doing well.   No heart symptoms.  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  Marykay Lex, MD

## 2012-08-15 ENCOUNTER — Encounter: Payer: Self-pay | Admitting: Cardiology

## 2012-08-15 NOTE — Progress Notes (Signed)
Patient ID: DETRELL UMSCHEID, male   DOB: 1950-10-22, 62 y.o.   MRN: [id number] WUJ:WJXBJ,YNWGNF DAVIDSON, MD  Clinic Note: Chief Complaint  Patient presents with  . Annual Exam    no chest pain ,no sob ,no edema  ,need DOT  physical completed by the first of Aug, Naispan causes burning sensation a lot    HPI: JAYSTIN MCGARVEY is a 62 y.o. male with a cardiac history as noted below with CABG back in 2000. He had cardiac catheterization back in 2011 for what looks to be a false positive stress test. He is not really having significant cardiac symptoms. His stress test were done not for symptoms, but for his working as a Biomedical scientist. 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 This is my first time seeing Mr. Rathgeber. He was last seen by Dr. Clarene Duke a year ago. He was doing well at that time. He was recuperating from his chemotherapy and radiation treatment of his tonsillar cancer. He obviously quit smoking at that time. He lost a lot of weight and imaging also his medications. By now she is back on some of his blood pressure medications with the exception of HCTZ.   Interval History: He comes in today doing very well as usual. He denies any chest pain or shortness of breath at rest or exertion. No PND, orthopnea or edema. No lightheadedness or dizziness, weakness or syncope/near-syncope. No TIA or amaurosis fugax symptoms. No melena, hematochezia or hematuria symptoms. 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 He does mention that he did have a strep goes for walks intermittently. He is not driving straight long distances anymore, without stops. 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  Past Medical History  Diagnosis Date  . Tonsillar cancer dx'd 04/2010    Treated with radiation and chemotherapy. Competent for  significant weight loss.  . Hypertension   . CAD S/P percutaneous coronary angioplasty Prior to 2000    Previous stent in LAD with ISR leading to CABG. Last cath 2011 for ? false + TM Myoview  . CAD (coronary artery disease), native coronary artery     s/p CABG x 3 for prox disesae in native vessels.  . Dyslipidemia, goal LDL below 70   . Ischemic cardiomyopathy      EF of roughly 40-45% by cath.no echogram done.   . Hypothyroidism 04/08/2011    Prior Cardiac Evaluation and Past Surgical History: Past Surgical History  Procedure Laterality Date  . Coronary artery bypass graft  2000    X3: LIMA-LAD, SVG-OM, SVG-RPDA. (Done for severe LAD in-stent restenosis, 60% OM1 and significant RCA disease.  . Cardiac catheterization  2011    Done for abnormal Myoview: Patent grafts. Proximal left main disease subtotaled LAD after the stent with significant ISR. Small circumflex vessel with 60% OM lesion prior to graft insertion. 100% proximal RCA occlusion.  [name] Nm myoview ltd  2011    Question of small to moderate mid anterior defect with reversibility -- no lesion to explain. False positive    Allergies  Allergen Reactions  . Codeine Other (See Comments)    Jitters    Current Outpatient Prescriptions  Medication Sig Dispense Refill  . amLODipine (NORVASC) 5 MG tablet Take 5 mg by mouth Daily.      [name]  aspirin 325 MG tablet Take 325 mg by mouth daily.      [name] atorvastatin (LIPITOR) 40 MG tablet Take 40 mg by mouth daily.      . fish oil-omega-3 fatty acids 1000 MG capsule Take 1 g by mouth daily.      . folic acid (FOLVITE) 400 MCG tablet Take 400 mcg by mouth daily.      [name] levothyroxine (SYNTHROID) 50 MCG tablet Take 1 tablet (50 mcg total) by mouth daily.  90 tablet  3  . lisinopril (PRINIVIL,ZESTRIL) 40 MG tablet Take 40 mg by mouth Daily.      . mirtazapine (REMERON) 15 MG tablet Take 15 mg by mouth at bedtime.      . Multiple Vitamins-Minerals (MULTIVITAMIN WITH MINERALS) tablet Take 1  tablet by mouth daily.      [name] NEXIUM 40 MG capsule Take 40 mg by mouth Daily.      . niacin (NIASPAN) 500 MG CR tablet Take 500 mg by mouth at bedtime.       No current facility-administered medications for this visit.    History   Social History  . Marital Status: Divorced    Spouse Name: N/A    Number of Children: N/A  . Years of Education: N/A   Occupational History  . Not on file.   Social History Main Topics  . Smoking status: Former Smoker    Quit date: 08/15/2010  . Smokeless tobacco: Not on file  . Alcohol Use: Yes     Comment: beers  . Drug Use: No  . Sexually Active: Not on file   Other Topics Concern  . 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 His divorced father of 3 stop smoking in March 2012. He has occasional alcohol.    ROS: A comprehensive Review of Systems - Negative except Pertinent positives above. He says his family start of tobacco abuse weight that he lost during chemotherapy and radiation therapy. his appetite, any better as well.  PHYSICAL EXAM BP 136/76  Pulse 87  Ht 5\' 7"  (1.702 m)  Wt 152 lb 3.2 oz (69.037 kg)  BMI 23.83 kg/m2 General appearance: alert, cooperative, appears stated age, no distress and Healthy-appearing reviewed well-nourished and well-groomed. Answers questions appropriate. His weight is up from 135 pounds during last visit. Neck: no carotid bruit and no JVD Lungs: clear to auscultation bilaterally, normal percussion bilaterally and Nonlabored, good air movement. Heart: regular rate and rhythm, S1, S2 normal, no murmur, click, rub or gallop and normal apical impulse Abdomen: soft, non-tender; bowel sounds normal; no masses,  no organomegaly Extremities: extremities normal, atraumatic, no cyanosis or edema Pulses: 2+ and symmetric Neurologic: Grossly  normal  WUJ:[id number] today: Yes Rate: 87 , Rhythm:  normal sinus, septal infarct age undetermined. Otherwise normal ECG. No significant change.  Recent Labs: no recent labs.   ASSESSMENT:  Overall very stable from a cardiac standpoint. No active symptoms.  CAD S/P percutaneous coronary angioplasty He is due for his followup annual stress test. We have been doing treadmill stress test as they've been relatively active and his Myoview images were also positive on last evaluation. He remains asymptomatic. He is on aspirin which he can reduce to one half dose if not taking Ecotrin. He is on a statin and ACE  inhibitor. He is on beta blocker for reasons not quite sure of. He may have stopped when he was hypotensive during his chemotherapy. With him not having symptoms. We'll discontinue on with current medications without a beta blocker.   Plan: Exercise tolerance test  Ischemic cardiomyopathy mild to moderate His doesn't act like anyone who has any problems whatsoever with heart failure. He has not had an echo checked in quite some time. I think when I see him back in about a year but began echo just to a new baseline on him. But would like to park his symptoms are not pressed to do so. 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  Hypertension Back on some of his medications. On ACE inhibitor and also a dihydropyridine calcium channel blocker. Blood pressures pre-well-controlled on his current regimen. He does have the potential blood pressure remained at a beta blocker if he were to have symptoms. He does a blood pressure had beta blocker, but with him being so asymptomatic, I am reluctant to add medications.  Dyslipidemia, goal LDL below 70 His back on a statin now. I think his primary doctor checks his lipids but he it to have been checked relatively routinely with me on a statin along with liver function tests. He denies any myalgias or arthralgias.    PLAN: Per  problem list. Orders Placed This Encounter  Procedures  . EKG 12-Lead  . Exercise tolerance test    Standing Status: Future     Number of Occurrences:      Standing Expiration Date: 08/14/2013    Order Specific Question:  Where should this test be performed    Answer:  [facility]   Meds ordered this encounter  Medications  . Multiple Vitamins-Minerals (MULTIVITAMIN WITH MINERALS) tablet    Sig: Take 1 tablet by mouth daily.  . fish oil-omega-3 fatty acids 1000 MG capsule    Sig: Take 1 g by mouth daily.    Followup: In one year  DAVID W. Herbie Baltimore, M.D., M.S. [facility] [address] [place], [place]  [address]  [phone] Pager # [phone]

## 2012-08-16 DIAGNOSIS — I10 Essential (primary) hypertension: Secondary | ICD-10-CM | POA: Insufficient documentation

## 2012-08-16 DIAGNOSIS — E785 Hyperlipidemia, unspecified: Secondary | ICD-10-CM | POA: Insufficient documentation

## 2012-08-16 DIAGNOSIS — I255 Ischemic cardiomyopathy: Secondary | ICD-10-CM | POA: Insufficient documentation

## 2012-08-16 NOTE — Assessment & Plan Note (Signed)
His doesn't act like anyone who has any problems whatsoever with heart failure. He has not had an echo checked in quite some time. I think when I see him back in about a year but began echo just to a new baseline on him. But would like to park his symptoms are not pressed to do so. 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

## 2012-08-16 NOTE — Assessment & Plan Note (Addendum)
He is due for his followup annual stress test. We have been doing treadmill stress test as they've been relatively active and his Myoview images were also positive on last evaluation. He remains asymptomatic. He is on aspirin which he can reduce to one half dose if not taking Ecotrin. He is on a statin and ACE inhibitor. He is on beta blocker for reasons not quite sure of. He may have stopped when he was hypotensive during his chemotherapy. With him not having symptoms. We'll discontinue on with current medications without a beta blocker.   Plan: Exercise tolerance test

## 2012-08-16 NOTE — Assessment & Plan Note (Signed)
Back on some of his medications. On ACE inhibitor and also a dihydropyridine calcium channel blocker. Blood pressures pre-well-controlled on his current regimen. He does have the potential blood pressure remained at a beta blocker if he were to have symptoms. He does a blood pressure had beta blocker, but with him being so asymptomatic, I am reluctant to add medications.

## 2012-08-16 NOTE — Assessment & Plan Note (Signed)
His back on a statin now. I think his primary doctor checks his lipids but he it to have been checked relatively routinely with me on a statin along with liver function tests. He denies any myalgias or arthralgias.

## 2012-08-17 ENCOUNTER — Other Ambulatory Visit: Payer: Self-pay | Admitting: *Deleted

## 2012-08-17 MED ORDER — NIACIN ER (ANTIHYPERLIPIDEMIC) 500 MG PO TBCR: 500.0000 mg | EXTENDED_RELEASE_TABLET | Freq: Every day | ORAL | Status: DC

## 2012-08-17 NOTE — Telephone Encounter (Signed)
Rx was sent to pharmacy electronically. 

## 2012-08-21 ENCOUNTER — Ambulatory Visit (HOSPITAL_COMMUNITY)
Admission: RE | Admit: 2012-08-21 | Discharge: 2012-08-21 | Disposition: A | Discharge status: Home / Self Care | Payer: BC Managed Care – PPO | Source: Ambulatory Visit | Attending: Cardiovascular Disease | Admitting: Cardiovascular Disease

## 2012-08-21 DIAGNOSIS — I2510 Atherosclerotic heart disease of native coronary artery without angina pectoris: Secondary | ICD-10-CM | POA: Insufficient documentation

## 2012-08-21 DIAGNOSIS — Z9861 Coronary angioplasty status: Secondary | ICD-10-CM

## 2012-08-22 ENCOUNTER — Telehealth: Payer: Self-pay | Admitting: Cardiology

## 2012-08-22 ENCOUNTER — Encounter (HOSPITAL_COMMUNITY): Payer: Self-pay

## 2012-08-22 DIAGNOSIS — E039 Hypothyroidism, unspecified: Secondary | ICD-10-CM

## 2012-08-22 DIAGNOSIS — C099 Malignant neoplasm of tonsil, unspecified: Secondary | ICD-10-CM

## 2012-08-22 MED ORDER — AMLODIPINE BESYLATE 5 MG PO TABS: 5.0000 mg | ORAL_TABLET | Freq: Every day | ORAL | Status: DC

## 2012-08-22 MED ORDER — ESOMEPRAZOLE MAGNESIUM 40 MG PO CPDR: 40.0000 mg | DELAYED_RELEASE_CAPSULE | Freq: Every day | ORAL | Status: DC

## 2012-08-22 NOTE — Telephone Encounter (Signed)
Need refill on Nexium 40mg  #90 and Norvasc 5mg  #90

## 2012-08-22 NOTE — Telephone Encounter (Signed)
Refills sent to pharmacy. 

## 2012-08-28 ENCOUNTER — Telehealth: Payer: Self-pay | Admitting: Cardiology

## 2012-08-28 NOTE — Telephone Encounter (Signed)
Message forwarded to Medical Records to contact pt to process ROI.  No stress test in Epic.  See paper chart for records.

## 2012-08-28 NOTE — Telephone Encounter (Signed)
Mr.Heimsoth is asking for the results of his stress test  So that he may give them to the [facility] to take to DO T for his physical .. Please Call..  Thanks

## 2012-11-12 ENCOUNTER — Other Ambulatory Visit: Payer: Self-pay | Admitting: Oncology

## 2012-11-15 ENCOUNTER — Other Ambulatory Visit: Payer: Self-pay | Admitting: Oncology

## 2012-11-15 DIAGNOSIS — C109 Malignant neoplasm of oropharynx, unspecified: Secondary | ICD-10-CM

## 2012-11-15 DIAGNOSIS — E039 Hypothyroidism, unspecified: Secondary | ICD-10-CM

## 2012-12-14 ENCOUNTER — Other Ambulatory Visit: Payer: Self-pay | Admitting: Hematology and Oncology

## 2012-12-14 DIAGNOSIS — C099 Malignant neoplasm of tonsil, unspecified: Secondary | ICD-10-CM

## 2012-12-14 DIAGNOSIS — E039 Hypothyroidism, unspecified: Secondary | ICD-10-CM

## 2012-12-17 ENCOUNTER — Encounter: Payer: Self-pay | Admitting: Hematology and Oncology

## 2012-12-17 ENCOUNTER — Other Ambulatory Visit (HOSPITAL_BASED_OUTPATIENT_CLINIC_OR_DEPARTMENT_OTHER): Payer: BC Managed Care – PPO | Admitting: Lab

## 2012-12-17 ENCOUNTER — Telehealth: Payer: Self-pay | Admitting: Hematology and Oncology

## 2012-12-17 ENCOUNTER — Ambulatory Visit (HOSPITAL_BASED_OUTPATIENT_CLINIC_OR_DEPARTMENT_OTHER): Payer: BC Managed Care – PPO | Admitting: Hematology and Oncology

## 2012-12-17 VITALS — BP 145/76 | HR 84 | Temp 98.4°F | Resp 18 | Ht 67.0 in | Wt 154.5 lb

## 2012-12-17 DIAGNOSIS — E039 Hypothyroidism, unspecified: Secondary | ICD-10-CM

## 2012-12-17 DIAGNOSIS — C01 Malignant neoplasm of base of tongue: Secondary | ICD-10-CM

## 2012-12-17 DIAGNOSIS — B977 Papillomavirus as the cause of diseases classified elsewhere: Secondary | ICD-10-CM

## 2012-12-17 DIAGNOSIS — C099 Malignant neoplasm of tonsil, unspecified: Secondary | ICD-10-CM

## 2012-12-17 LAB — COMPREHENSIVE METABOLIC PANEL (CC13)
Albumin: 3.8 g/dL (ref 3.5–5.0)
BUN: 9.2 mg/dL (ref 7.0–26.0)
Calcium: 9.7 mg/dL (ref 8.4–10.4)
Chloride: 104 mEq/L (ref 98–109)
Creatinine: 1.3 mg/dL (ref 0.7–1.3)
Glucose: 116 mg/dl (ref 70–140)
Potassium: 4.5 mEq/L (ref 3.5–5.1)

## 2012-12-17 LAB — T4, FREE: Free T4: 1.55 ng/dL (ref 0.80–1.80)

## 2012-12-17 LAB — TSH CHCC: TSH: 2.207 m(IU)/L (ref 0.320–4.118)

## 2012-12-17 NOTE — Telephone Encounter (Signed)
Gave pt appt for lab and MD on May 2015 °

## 2012-12-17 NOTE — Progress Notes (Signed)
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 for further management  SUMMARY OF ONCOLOGIC HISTORY: Oncology History   Tonsillar cancer, HPV positive   Primary site: Pharynx - Oropharynx (Left)   Staging method: AJCC 7th Edition   Clinical: Stage IVA (T2, N2, M0) signed by Artis Delay, MD on 12/17/2012  1:43 PM   Pathologic: Stage IVA (T2, N2, cM0) signed by Artis Delay, MD on 12/17/2012  1:43 PM   Summary: Stage IVA (T2, N2, cM0)      Tonsillar cancer   04/12/2010 Procedure Tonsil biopsy and base of tongue biopsy confirmed [facility]   04/26/2010 Imaging PET/CT showed cancer in the left palatine tonsil primary with level II left-sided cervicalnodal metastasis.  A right paratracheal mildly enlarged, mildly hypermetabolic lymph node.     05/24/2010 - 07/08/2010 Chemotherapy Patient received high dose cisplatin with XRT   10/12/2010 Imaging PEt/Ct showed complete response to Rx   04/11/2012 Imaging Stable postradiation changes in the neck.  No recurrent mass or adenopathy    INTERVAL HISTORY: Mark Mendoza 62 y.o. male returns for further followup. He denies further smoking. He drinks bourbon occasionally. History altered taste sensation but is improving. His dry mouth is resolved. He denies any probable lymph node swelling in his neck. Denies any recent sore throat.  I have reviewed the past medical history, past surgical history, social history and family history with the patient and they are unchanged from previous note.  ALLERGIES:  is allergic to codeine.  MEDICATIONS:  Current Outpatient Prescriptions  Medication  Sig Dispense Refill  . amLODipine (NORVASC) 5 MG tablet Take 1 tablet (5 mg total) by mouth daily.  90 tablet  3  . aspirin 325 MG tablet Take 325 mg by mouth daily.      [name] atorvastatin (LIPITOR) 40 MG tablet Take 40 mg by mouth daily.      [name] esomeprazole (NEXIUM) 40 MG capsule Take 1 capsule (40 mg total) by mouth daily before breakfast.  90 capsule  3  . fish oil-omega-3 fatty acids 1000 MG capsule Take 1 g by mouth daily.      . folic acid (FOLVITE) 400 MCG tablet Take 400 mcg by mouth daily.      [name] levothyroxine (SYNTHROID, LEVOTHROID) 50 MCG tablet TAKE 1 TABLET BY MOUTH EVERY DAY  90 tablet  0  . lisinopril (PRINIVIL,ZESTRIL) 40 MG tablet Take 40 mg by mouth Daily.      . mirtazapine (REMERON) 15 MG tablet Take 15 mg by mouth at bedtime.      . Multiple Vitamins-Minerals (MULTIVITAMIN WITH MINERALS) tablet Take 1 tablet by mouth daily.      . niacin (NIASPAN) 500 MG CR tablet Take 1 tablet (500 mg total) by mouth at bedtime.  90 tablet  3   No current facility-administered medications for this visit.    REVIEW OF SYSTEMS:   Constitutional: Denies fevers, chills or abnormal weight loss Eyes: Denies blurriness of vision Respiratory: Denies cough, dyspnea or wheezes Cardiovascular: Denies palpitation, chest discomfort or lower extremity swelling Gastrointestinal:  Denies nausea, heartburn or change in bowel habits Skin: Denies abnormal skin rashes Lymphatics: Denies new lymphadenopathy or easy  bruising Neurological:Denies numbness, tingling or new weaknesses Behavioral/Psych: Mood is stable, no new changes  All other systems were reviewed with the patient and are negative.  PHYSICAL EXAMINATION: ECOG PERFORMANCE STATUS: 0 - Asymptomatic  Filed Vitals:   12/17/12 1338  BP: 145/76  Pulse: 84  Temp: 98.4 F (36.9 C)  Resp: 18   Filed Weights   12/17/12 1338  Weight: 154 lb 8 oz (70.081 kg)    GENERAL:alert, no distress and comfortable SKIN: skin color, texture, turgor are  normal, no rashes or significant lesions EYES: normal, Conjunctiva are pink and non-injected, sclera clear OROPHARYNX:no exudate, no erythema and lips, buccal mucosa, and tongue normal  NECK: supple, mild radiation induced fibrosis changes, thyroid normal size, non-tender, without nodularity LYMPH:  no palpable lymphadenopathy in the cervical, axillary or inguinal LUNGS: clear to auscultation and percussion with normal breathing effort HEART: regular rate & rhythm and no murmurs and no lower extremity edema ABDOMEN:abdomen soft, non-tender and normal bowel sounds Musculoskeletal:no cyanosis of digits and no clubbing  NEURO: alert & oriented x 3 with fluent speech, no focal motor/sensory deficits  LABORATORY DATA:  I have reviewed the data as listed    Component Value Date/Time   NA 136 04/11/2012 1300   NA 140 04/08/2011 1138   NA 137 10/13/2010 1141   K 4.4 04/11/2012 1300   K 4.3 04/08/2011 1138   K 3.9 10/13/2010 1141   CL 100 04/11/2012 1300   CL 97* 04/08/2011 1138   CL 101 10/13/2010 1141   CO2 28 04/11/2012 1300   CO2 27 04/08/2011 1138   CO2 25 10/13/2010 1141   GLUCOSE 139* 04/11/2012 1300   GLUCOSE 100 04/08/2011 1138   GLUCOSE 108* 10/13/2010 1141   BUN 15.1 04/11/2012 1300   BUN 14 04/08/2011 1138   BUN 9 10/13/2010 1141   CREATININE 1.1 04/11/2012 1300   CREATININE 1.1 04/08/2011 1138   CREATININE 1.03 10/13/2010 1141   CALCIUM 9.9 04/11/2012 1300   CALCIUM 9.4 04/08/2011 1138   CALCIUM 9.8 10/13/2010 1141   PROT 7.5 04/11/2012 1300   PROT 6.9 04/08/2011 1138   PROT 6.6 10/13/2010 1141   ALBUMIN 3.6 04/11/2012 1300   ALBUMIN 4.2 10/13/2010 1141   AST 23 04/11/2012 1300   AST 29 04/08/2011 1138   AST 20 10/13/2010 1141   ALT 14 04/11/2012 1300   ALT 24 04/08/2011 1138   ALT 12 10/13/2010 1141   ALKPHOS 105 04/11/2012 1300   ALKPHOS 93* 04/08/2011 1138   ALKPHOS 85 10/13/2010 1141   BILITOT 0.38 04/11/2012 1300   BILITOT 0.90 04/08/2011 1138   BILITOT 0.4 10/13/2010 1141   GFRNONAA >60  04/09/2010 1059   GFRAA  Value: >60        The eGFR has been calculated using the MDRD equation. This calculation has not been validated in all clinical situations. eGFR's persistently <60 mL/min signify possible Chronic Kidney Disease. 04/09/2010 1059    No results found for this basename: SPEP, UPEP,  kappa and lambda light chains    Lab Results  Component Value Date   WBC 7.5 04/11/2012   NEUTROABS 4.9 04/11/2012   HGB 15.5 04/11/2012   HCT 44.6 04/11/2012   MCV 97.3 04/11/2012   PLT 232 04/11/2012      Chemistry      Component Value Date/Time   NA 136 04/11/2012 1300   NA 140 04/08/2011 1138   NA 137 10/13/2010 1141   K 4.4 04/11/2012 1300   K  4.3 04/08/2011 1138   K 3.9 10/13/2010 1141   CL 100 04/11/2012 1300   CL 97* 04/08/2011 1138   CL 101 10/13/2010 1141   CO2 28 04/11/2012 1300   CO2 27 04/08/2011 1138   CO2 25 10/13/2010 1141   BUN 15.1 04/11/2012 1300   BUN 14 04/08/2011 1138   BUN 9 10/13/2010 1141   CREATININE 1.1 04/11/2012 1300   CREATININE 1.1 04/08/2011 1138   CREATININE 1.03 10/13/2010 1141      Component Value Date/Time   CALCIUM 9.9 04/11/2012 1300   CALCIUM 9.4 04/08/2011 1138   CALCIUM 9.8 10/13/2010 1141   ALKPHOS 105 04/11/2012 1300   ALKPHOS 93* 04/08/2011 1138   ALKPHOS 85 10/13/2010 1141   AST 23 04/11/2012 1300   AST 29 04/08/2011 1138   AST 20 10/13/2010 1141   ALT 14 04/11/2012 1300   ALT 24 04/08/2011 1138   ALT 12 10/13/2010 1141   BILITOT 0.38 04/11/2012 1300   BILITOT 0.90 04/08/2011 1138   BILITOT 0.4 10/13/2010 1141     ASSESSMENT & PLAN:  #1 squamous cell carcinoma the left tonsil #2 invasive squamous cell carcinoma of the right tongue base The patient has completed all his treatment. He is more than 2 years out. His last CT scan 8 months ago was normal. I recommend routine followup with ENT for direct laryngoscope the evaluation to rule out cancer recurrence. The patient has not seen Dr. Pollyann Kennedy for some time. I stress the importance of regular followup. The  patient will call Dr. Lucky Rathke office. I recommend the patient to stop drinking alcohol due to high risk of recurrence. #3 hypothyroidism His blood test is still pending. We will continue his current thyroid medication and adjust the dose as needed #4 preventive care We discussed the importance of preventive care and reviewed the vaccination programs. He does not have any prior allergic reactions to influenza vaccination. He declined to proceed with influenza vaccination today and will get it himself through his PCP  Orders Placed This Encounter  Procedures  . Basic metabolic panel    Standing Status: Future     Number of Occurrences:      Standing Expiration Date: 12/17/2013  . CBC with Differential    Standing Status: Future     Number of Occurrences:      Standing Expiration Date: 09/08/2013  . T4, free    Standing Status: Future     Number of Occurrences:      Standing Expiration Date: 12/17/2013  . TSH    Standing Status: Future     Number of Occurrences:      Standing Expiration Date: 12/17/2013   All questions were answered. The patient knows to call the clinic with any problems, questions or concerns. No barriers to learning was detected. I spent 25 minutes counseling the patient face to face. The total time spent in the appointment was 40 minutes and more than 50% was on counseling and review of test results     [facility], Kaleiah Kutzer, MD 12/17/2012 1:51 PM

## 2013-03-15 ENCOUNTER — Telehealth: Payer: Self-pay | Admitting: Hematology and Oncology

## 2013-03-15 NOTE — Telephone Encounter (Signed)
, °

## 2013-04-01 IMAGING — CT CT NECK W/ CM
4 of 5 series · 16 of 33 positions shown, 19 images · IV contrast (OMNIPAQUE)
Comparison: CT neck 10/12/2011

CLINICAL DATA: History of cancer of the tonsil and tongue.  Post
radiation and chemotherapy.

CT NECK WITH CONTRAST
TECHNIQUE: Multidetector CT imaging of the neck was performed with
intravenous contrast.
Contrast: 100mL OMNIPAQUE IOHEXOL 300 MG/ML  SOLN

[Series 2: neck st · axial · 0.44mm/px · z∈[-183,-23]mm · 5 of 121 slices shown, 7 images]
[im 21/121  soft-tissue]
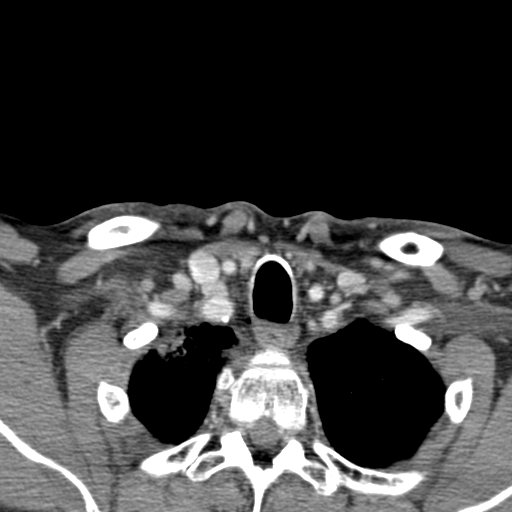
[im 21/121  bone]
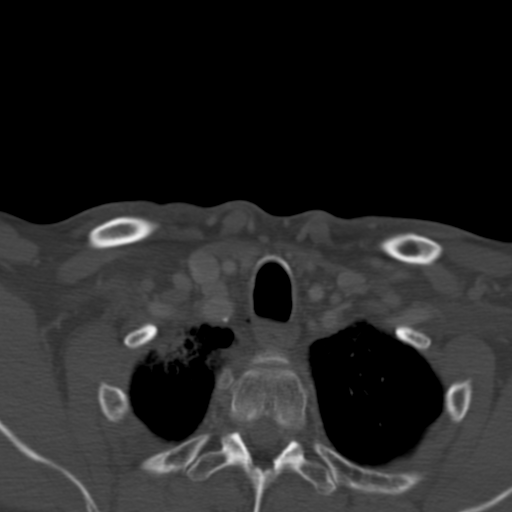
[im 41/121  bone]
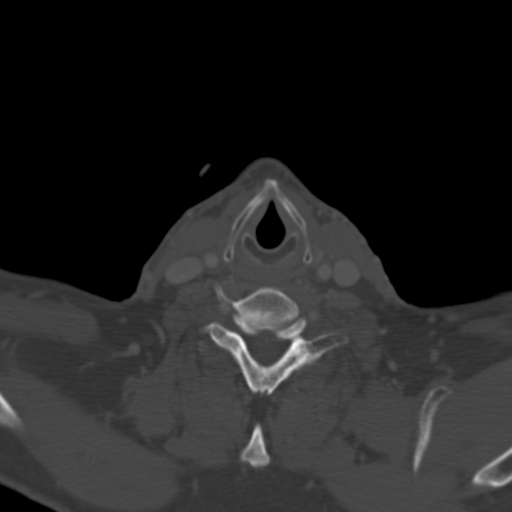
[im 61/121  bone]
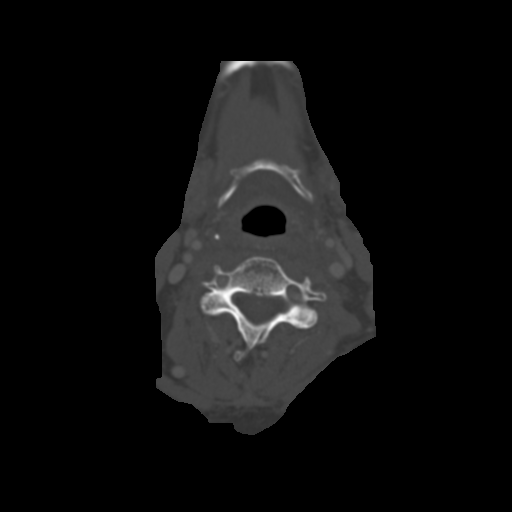
[im 81/121  bone]
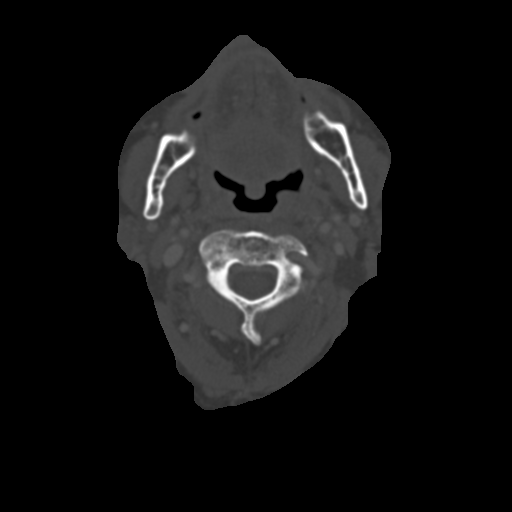
[im 101/121  soft-tissue]
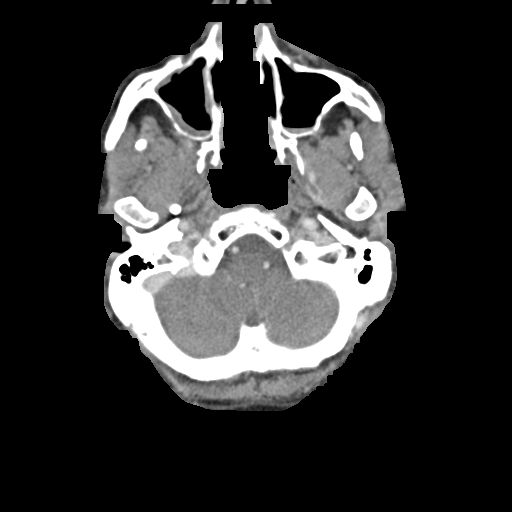
[im 101/121  bone]
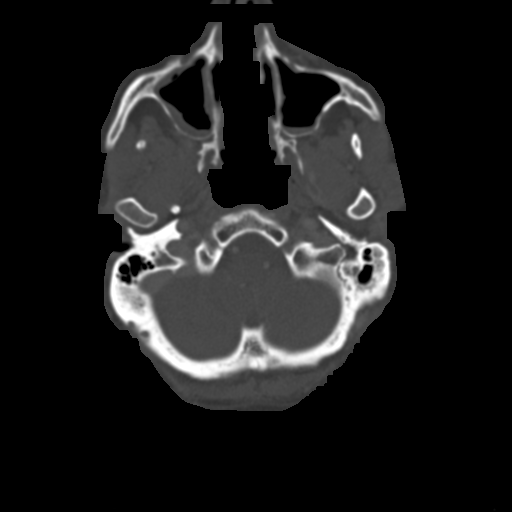

[Series 602: <mpr thick range> · coronal · 0.47mm/px · 3 of 64 slices shown]
[im 23/64  bone]
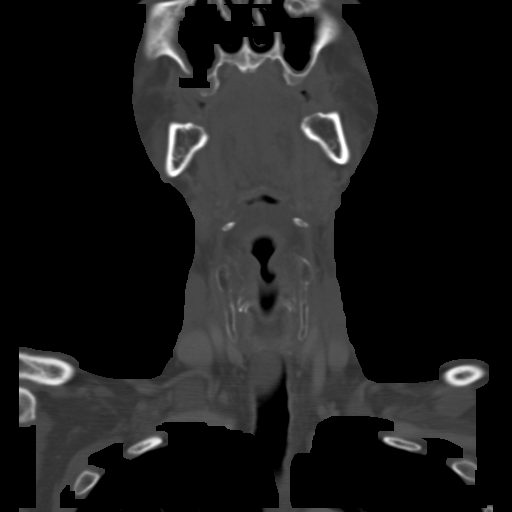
[im 29/64  bone]
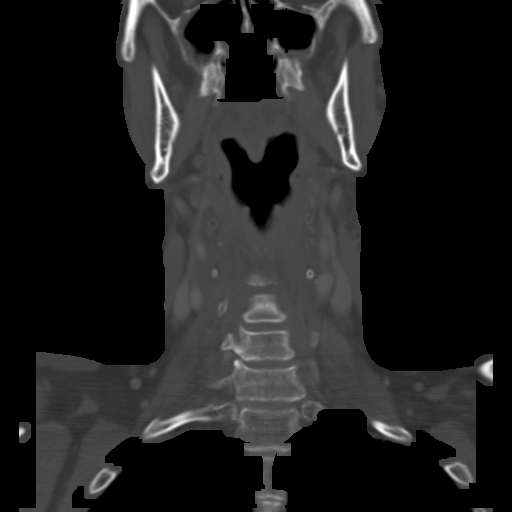
[im 35/64  bone]
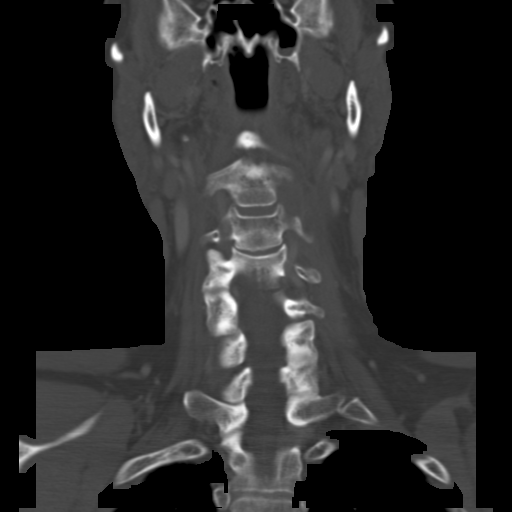

[Series 603: <mpr thick range(1)> · axial · 0.47mm/px · z∈[-229,-130]mm · 3 of 81 slices shown]
[im 21/81  bone]
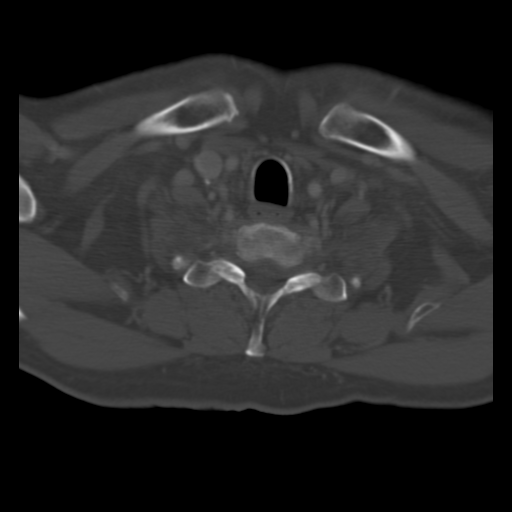
[im 41/81  bone]
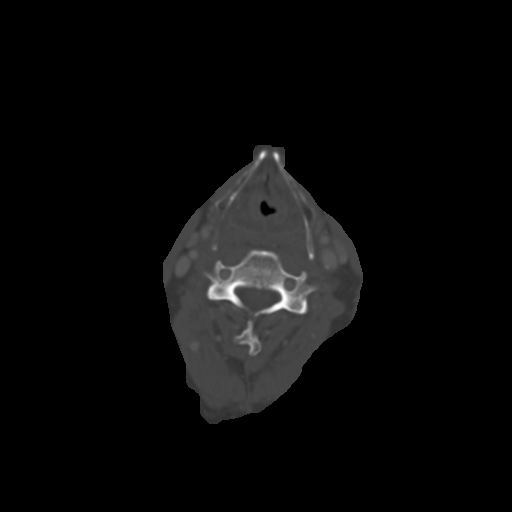
[im 61/81  bone]
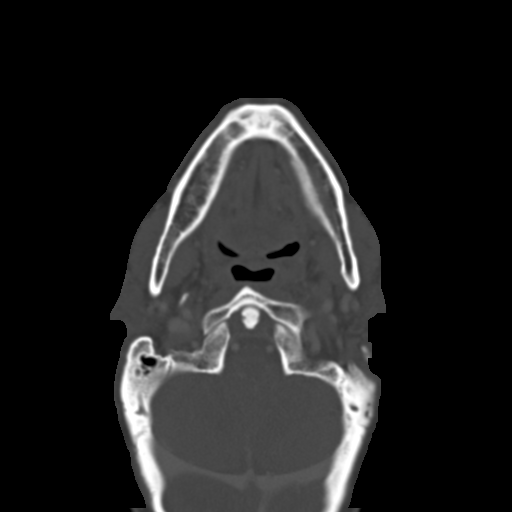

[Series 604: <mpr thick range(2)> · sagittal · 0.47mm/px · 5 of 53 slices shown, 6 images]
[im 18/53  bone]
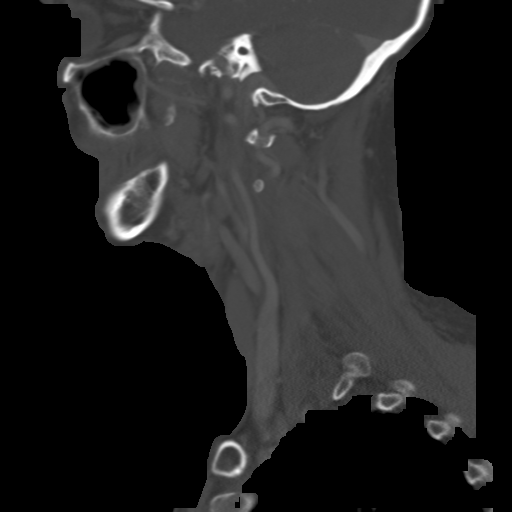
[im 22/53  bone]
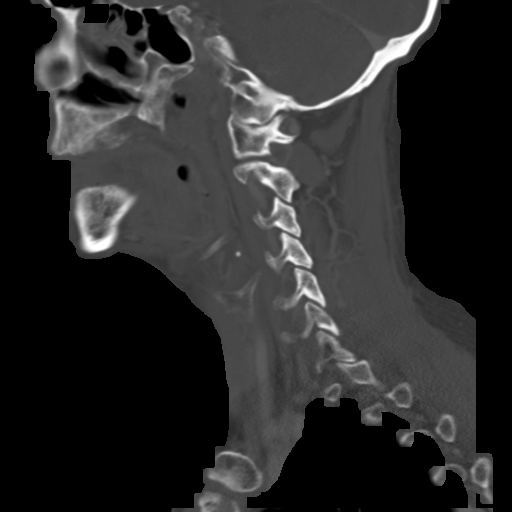
[im 27/53  soft-tissue]
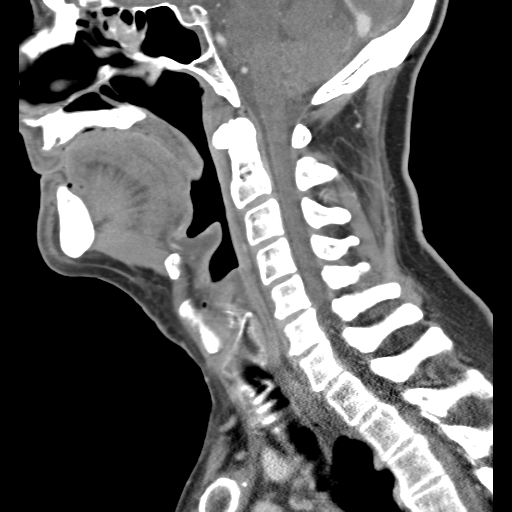
[im 27/53  bone]
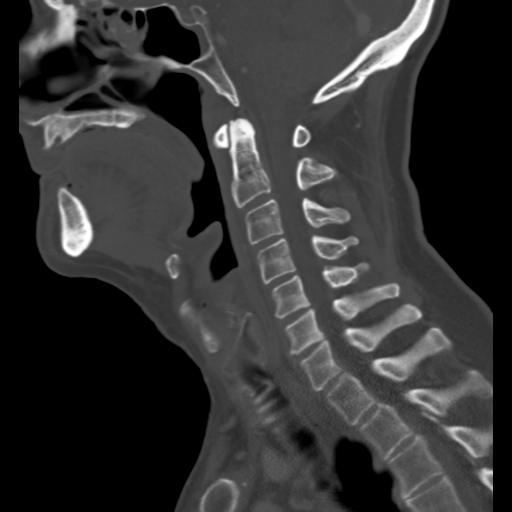
[im 31/53  bone]
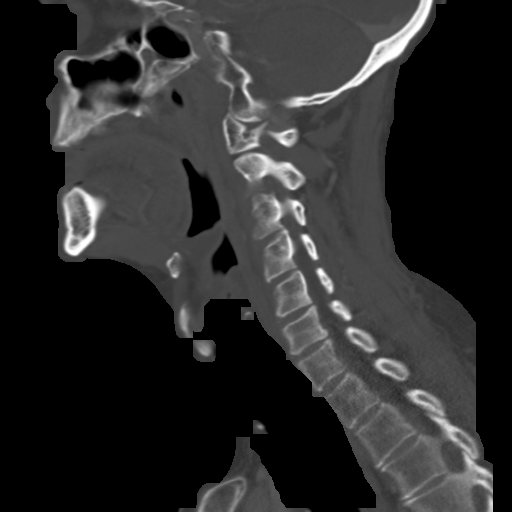
[im 35/53  bone]
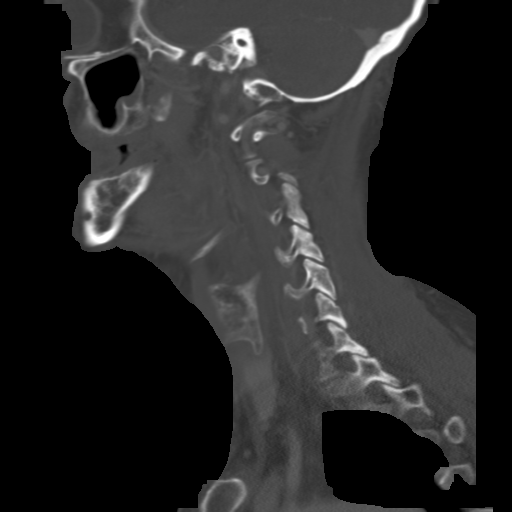

[16 of 33 positions shown; findings below may reference images not displayed]

FINDINGS: Postradiation changes are again noted with edema
throughout the epiglottis and hypopharynx.  Edema extends into the
false cord on the left which is more prominent than  on the prior
study however the glottis was closed previously.  These findings
are most compatible with postradiation edema.  Postradiation
changes are noted in the salivary glands which show mild
hyperenhancement.  There is thickening of the platysmus and
subcutaneous tissues from radiation.

Negative for recurrent mass lesion.  No adenopathy.

Air-fluid level left sphenoid sinus. Mucosal edema in the maxillary
sinus bilaterally.  Air-fluid level left maxillary sinus.

Scarring in the lung apices may be due to radiation.  No acute bony
change.
IMPRESSION: Stable postradiation changes in the neck.  No recurrent mass or
adenopathy.

## 2013-05-07 ENCOUNTER — Encounter: Payer: Self-pay | Admitting: *Deleted

## 2013-05-09 ENCOUNTER — Ambulatory Visit (INDEPENDENT_AMBULATORY_CARE_PROVIDER_SITE_OTHER): Payer: BC Managed Care – PPO | Admitting: Cardiology

## 2013-05-09 VITALS — BP 138/78 | HR 97 | Ht 66.0 in | Wt 156.6 lb

## 2013-05-09 DIAGNOSIS — R002 Palpitations: Secondary | ICD-10-CM

## 2013-05-09 DIAGNOSIS — I10 Essential (primary) hypertension: Secondary | ICD-10-CM

## 2013-05-09 DIAGNOSIS — Z9861 Coronary angioplasty status: Secondary | ICD-10-CM

## 2013-05-09 DIAGNOSIS — Z024 Encounter for examination for driving license: Secondary | ICD-10-CM

## 2013-05-09 DIAGNOSIS — Z0289 Encounter for other administrative examinations: Secondary | ICD-10-CM

## 2013-05-09 DIAGNOSIS — I2510 Atherosclerotic heart disease of native coronary artery without angina pectoris: Secondary | ICD-10-CM

## 2013-05-09 DIAGNOSIS — E785 Hyperlipidemia, unspecified: Secondary | ICD-10-CM

## 2013-05-09 DIAGNOSIS — I255 Ischemic cardiomyopathy: Secondary | ICD-10-CM

## 2013-05-09 DIAGNOSIS — I2589 Other forms of chronic ischemic heart disease: Secondary | ICD-10-CM

## 2013-05-09 MED ORDER — METOPROLOL TARTRATE 25 MG PO TABS: 25.0000 mg | ORAL_TABLET | Freq: Two times a day (BID) | ORAL | Status: DC

## 2013-05-09 NOTE — Patient Instructions (Addendum)
START METOPROLOL TART. 25 MG TWICE A DAY , FOR THE FIRST 6 DAY TAKE 1/2 TABLET TWICE A DAY, THEN CONTINUE WITH INSTRUCTION.  Your physician has requested that you have en exercise stress myoview. For further information please visit [email or web address]. Please follow instruction sheet, as given..JULY 2015  MAY RETURN TO WORK 4 /19/2015 WITHOUT RESTRICTION.  Your physician wants you to follow-up after myoview is completed in July/aug 2015 DR HARDING.  You will receive a reminder letter in the mail two months in advance. If you don't receive a letter, please call our office to schedule the follow-up appointment.

## 2013-05-12 ENCOUNTER — Encounter: Payer: Self-pay | Admitting: Cardiology

## 2013-05-12 DIAGNOSIS — R002 Palpitations: Secondary | ICD-10-CM | POA: Insufficient documentation

## 2013-05-12 DIAGNOSIS — Z024 Encounter for examination for driving license: Secondary | ICD-10-CM | POA: Insufficient documentation

## 2013-05-12 NOTE — Progress Notes (Signed)
Patient ID: Mark Mendoza, male   DOB: 1950-06-28, 63 y.o.   MRN: [id number] PTW:SFKCL,EXNTZG DAVIDSON, MD  Clinic Note: Chief Complaint  Patient presents with  . Follow-up    Refer by Dr Shelia Media for Palpitation, pt stated its been going on for several weeks, pt stated it just happen he doesn't have to be doing anything in particular, it last for at least 2-3 sec.    HPI: Mark Mendoza is a 63 y.o. male with a cardiac history as noted below -- CABG back in 2000, cardiac catheterization back in 2011 for what looks to be a false positive stress test. 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  It has been > 4 yrs since his last Nuclear ST. I last saw him in June 2014. He had a negative TM Stress Test Last year. He recently had a sleep study that was found to be okay.   He has been dealing with asthmatic bronchitis for the last few months. He saw Dr. Shelia Media on March 25 as well as April 8. He is referred back earlier than his usual followup at him due to his palpitations.  Interval History: Today he comes in doing relatively well overall. His is somewhat fatigued. He is still recuperating from her recent illness with bronchitis and congestion. With this his been having some occasional episodes of swimming headedness and dizziness. He also notes more frequent palpitations or irregular heartbeats. These come and go but are not necessarily sustained as rhythms.  He denies any chest pain or shortness of breath at rest or exertion. No PND, orthopnea or edema. No lightheadedness or dizziness, weakness or syncope/near-syncope. No TIA or amaurosis fugax symptoms. No melena, hematochezia or hematuria symptoms. No claudication.  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   Past Medical History  Diagnosis Date  . Tonsillar cancer dx'd 04/2010    Treated with radiation and chemotherapy.  Competent for significant weight loss.  . Hypertension   . CAD S/P percutaneous coronary angioplasty Prior to 2000    Previous stent in LAD with ISR leading to CABG. Last cath 2011 for ? false + TM Myoview  . CAD (coronary artery disease), native coronary artery     s/p CABG x 3 for prox disesae in native vessels.  . Dyslipidemia, goal LDL below 70   . Ischemic cardiomyopathy      EF of roughly 40-45% by cath.no echogram done.   . Hypothyroidism 04/08/2011  . GERD (gastroesophageal reflux disease)   . History of stress test 01/2009    Exercise myocardial perfusion imageing study with a small to moderate sized region of ischemia noted in the mid to apical anterior wall. There is scar noted in the apical region. The post stress left ventricle is normal in size.Abnormal Myocardial perfusion study..   Prior Cardiac Evaluation and Past Surgical History: Past Surgical History  Procedure Laterality Date  . Coronary artery bypass graft  2000    X3: LIMA-LAD, SVG-OM, SVG-RPDA. (Done for severe LAD in-stent restenosis, 60% OM1 and significant RCA disease.  . Cardiac catheterization  2011    Done for abnormal Myoview: Patent grafts. Proximal left main disease subtotaled LAD after the stent with significant ISR. Small circumflex vessel with 60% OM lesion prior to graft insertion. 100% proximal RCA occlusion.  [name] Nm myoview ltd  2011  Question of small to moderate mid anterior defect with reversibility -- no lesion to explain. False positive   MEDICATIONS & ALLERGIES REVIEWED IN EPIC SOCIAL & FAMILY HISTORY REVIEWED IN EPIC. -- Former long-term smoker smoking 1-1/2 packs a day for over 30 years. Now currently a nonsmoker.  ROS: A comprehensive Review of Systems - negative with exception of the symptoms noted in history of present illness. He has been noticing some decreased libido as well as hyper insomnia and was evaluated a sleep study. He has low testosterone levels  PHYSICAL EXAM BP 138/78   Pulse 97  Ht 5\' 6"  (1.676 m)  Wt 156 lb 9.6 oz (71.033 kg)  BMI 25.29 kg/m2 General appearance: alert, cooperative, appears stated age, no distress and Healthy-appearing reviewed well-nourished and well-groomed. Answers questions appropriately.  Neck: no carotid bruit and no JVD Lungs: CTAB, normal percussion bilaterally and Nonlabored, good air movement. Heart: RRR, S1, S2 normal, no murmur, click, rub or gallop and normal apical impulse Abdomen: soft, non-tender; bowel sounds normal; no masses,  no organomegaly Extremities: extremities normal, atraumatic, no cyanosis or edema Pulses: 2+ and symmetric Neurologic: Grossly normal  [id number]:[id number] today: Yes Rate: 97 , Rhythm:  normal sinus, septal infarct age undetermined. Otherwise normal ECG. No significant change.  Recent Labs: From PCP 04/17/2013:  CBC: WBC 10.1, hemoglobin 16.1, platelet 265.  CMP: Sodium 137, potassium 4.7, chloride 97, bicarbonate 32, BUN/creatinine 15/1.1, glucose 109, calcium 10.5. Total protein 8.1, albumin 4.3, total bilirubin 0.7, alkaline phosphatase 139, AST/ALT 2625.  TSH 2.02  ASSESSMENT:  Overall very stable from a cardiac standpoint. No active symptoms.  Intermittent palpitations The symptoms he describes sounds like either PACs or PVCs, with EF 40 and 45% range, I am concerned that it may be more likely to be PVCs. With his known CAD history and CABG times August and this could be potentially related to ischemia and would therefore prefer to have a more sensitive evaluation and treadmill stress test. It has been 4 years since his last nuclear stress test which we have the images in order to compared. The palpitations do not seem to be lasting longer to be consistent with an arrhythmia. I don't think it will help [place] out much to have him wear monitor.  Plan: Treadmill Myoview; start low-dose beta blocker metoprolol @ 25 mg BID   Ischemic cardiomyopathy mild to moderate Is that I think he has any  problems at all with heart failure. The first time seeing any symptoms potentially related to his heart is in these palpitations. He has not been on a beta blocker, so will therefore add a beta blocker and titrated as the twice a day dosing, however I would prefer to switch over to carvedilol or Toprol once we are able to see if he tolerates the beta blocker.  CAD in native artery - s/p PTCA, then CABG This new onset of palpitations, but may very well be related to his bronchitis, I would like to do that or ischemic evaluation in a treadmill stress test would allow.  He is about 4 years out from his last Myoview, it is reasonable to reevaluate with a Myoview at this time for better accuracy.  Continue ACE inhibitor and beta blocker as well as aspirin and statin  Hypertension Relatively well-controlled with current medications, I think the addition of beta blocker is reasonable, and may allow [place] to potentially come off of the amlodipine if he tolerates beta blocker. His resting heart rate is 97 beats a  minute, and perhaps looking, Toprol to carvedilol get additional blood pressure control.  Dyslipidemia, goal LDL below 70 Back on statin plus niacin. Followed by PCP.  Driver's permit physical examination He will need a stress test done in the summertime. He is close to bedtime and we can evaluate his palpitations as a new symptom that could potentially be related to ischemia. 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    PLAN: Per problem list. Orders Placed This Encounter  Procedures  . Myocardial Perfusion Imaging    Hold metoprolol the evening prior and the morning of test then take after test is completed    Standing Status: Future     Number of Occurrences:      Standing Expiration Date: 05/09/2014    Scheduling Instructions:     Schedule for July 2015    Order Specific Question:  Where should this test be performed    Answer:   [facility]    Order Specific Question:  Type of stress    Answer:  Exercise    Order Specific Question:  Patient weight in lbs    Answer:  156  . EKG 12-Lead    Order Specific Question:  Where should this test be performed    Answer:  OTHER   Meds ordered this encounter  Medications  . metoprolol tartrate (LOPRESSOR) 25 MG tablet    Sig: Take 1 tablet (25 mg total) by mouth 2 (two) times daily.    Dispense:  180 tablet    Refill:  3    Followup: In July-August unless stress test is abnormal  Leonie Man, M.D., M.S. Interventional Cardiologist  [place] Pager # [phone] 05/12/2013

## 2013-05-12 NOTE — Assessment & Plan Note (Signed)
Relatively well-controlled with current medications, I think the addition of beta blocker is reasonable, and may allow [place] to potentially come off of the amlodipine if he tolerates beta blocker. His resting heart rate is 97 beats a minute, and perhaps looking, Toprol to carvedilol get additional blood pressure control.

## 2013-05-12 NOTE — Assessment & Plan Note (Addendum)
This new onset of palpitations, but may very well be related to his bronchitis, I would like to do that or ischemic evaluation in a treadmill stress test would allow.  He is about 4 years out from his last Myoview, it is reasonable to reevaluate with a Myoview at this time for better accuracy.  Continue ACE inhibitor and beta blocker as well as aspirin and statin

## 2013-05-12 NOTE — Assessment & Plan Note (Signed)
Back on statin plus niacin. Followed by PCP.

## 2013-05-12 NOTE — Assessment & Plan Note (Signed)
He will need a stress test done in the summertime. He is close to bedtime and we can evaluate his palpitations as a new symptom that could potentially be related to ischemia. 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

## 2013-05-12 NOTE — Assessment & Plan Note (Addendum)
The symptoms he describes sounds like either PACs or PVCs, with EF 40 and 45% range, I am concerned that it may be more likely to be PVCs. With his known CAD history and CABG times August and this could be potentially related to ischemia and would therefore prefer to have a more sensitive evaluation and treadmill stress test. It has been 4 years since his last nuclear stress test which we have the images in order to compared. The palpitations do not seem to be lasting longer to be consistent with an arrhythmia. I don't think it will help [place] out much to have him wear monitor.  Plan: Treadmill Myoview; start low-dose beta blocker metoprolol @ 25 mg BID

## 2013-05-12 NOTE — Assessment & Plan Note (Signed)
Is that I think he has any problems at all with heart failure. The first time seeing any symptoms potentially related to his heart is in these palpitations. He has not been on a beta blocker, so will therefore add a beta blocker and titrated as the twice a day dosing, however I would prefer to switch over to carvedilol or Toprol once we are able to see if he tolerates the beta blocker.

## 2013-05-14 ENCOUNTER — Telehealth (HOSPITAL_COMMUNITY): Payer: Self-pay | Admitting: *Deleted

## 2013-05-15 ENCOUNTER — Encounter (HOSPITAL_COMMUNITY): Payer: Self-pay | Admitting: *Deleted

## 2013-05-22 ENCOUNTER — Encounter: Payer: Self-pay | Admitting: Internal Medicine

## 2013-06-14 ENCOUNTER — Telehealth (HOSPITAL_COMMUNITY): Payer: Self-pay

## 2013-06-17 ENCOUNTER — Ambulatory Visit: Payer: Self-pay | Admitting: Hematology and Oncology

## 2013-06-17 ENCOUNTER — Other Ambulatory Visit: Payer: Self-pay

## 2013-06-18 ENCOUNTER — Ambulatory Visit (HOSPITAL_COMMUNITY)
Admission: RE | Admit: 2013-06-18 | Discharge: 2013-06-18 | Disposition: A | Discharge status: Home / Self Care | Payer: BC Managed Care – PPO | Source: Ambulatory Visit | Attending: Cardiovascular Disease | Admitting: Cardiovascular Disease

## 2013-06-18 ENCOUNTER — Encounter (HOSPITAL_COMMUNITY): Payer: Self-pay | Admitting: *Deleted

## 2013-06-18 DIAGNOSIS — I2510 Atherosclerotic heart disease of native coronary artery without angina pectoris: Secondary | ICD-10-CM

## 2013-06-18 DIAGNOSIS — R0609 Other forms of dyspnea: Secondary | ICD-10-CM | POA: Insufficient documentation

## 2013-06-18 DIAGNOSIS — R5381 Other malaise: Secondary | ICD-10-CM | POA: Insufficient documentation

## 2013-06-18 DIAGNOSIS — R0989 Other specified symptoms and signs involving the circulatory and respiratory systems: Secondary | ICD-10-CM | POA: Insufficient documentation

## 2013-06-18 DIAGNOSIS — Z9861 Coronary angioplasty status: Secondary | ICD-10-CM

## 2013-06-18 DIAGNOSIS — I255 Ischemic cardiomyopathy: Secondary | ICD-10-CM

## 2013-06-18 DIAGNOSIS — R002 Palpitations: Secondary | ICD-10-CM | POA: Insufficient documentation

## 2013-06-18 DIAGNOSIS — Z024 Encounter for examination for driving license: Secondary | ICD-10-CM

## 2013-06-18 DIAGNOSIS — I10 Essential (primary) hypertension: Secondary | ICD-10-CM

## 2013-06-18 DIAGNOSIS — R5383 Other fatigue: Secondary | ICD-10-CM

## 2013-06-18 MED ORDER — TECHNETIUM TC 99M SESTAMIBI GENERIC - CARDIOLITE
30.0000 | Freq: Once | INTRAVENOUS | Status: AC | PRN
  Administered 2013-06-18: 30 via INTRAVENOUS

## 2013-06-18 MED ORDER — TECHNETIUM TC 99M SESTAMIBI GENERIC - CARDIOLITE
10.0000 | Freq: Once | INTRAVENOUS | Status: AC | PRN
  Administered 2013-06-18: 10 via INTRAVENOUS

## 2013-06-18 NOTE — Procedures (Addendum)
[place] [address] [place] [address] [phone]  Cardiology Nuclear Med Study  GRAIG HESSLING is a 63 y.o. male     MRN : [id number]     DOB: [birth year]  Procedure Date: 06/18/2013  Nuclear Med Background Indication for Stress Test:  Graft Patency and Stent Patency History:  Asthma and CAD;MI-1999;CABG X3-2000;STENT/PTCA;Ischemic cardiomyopathy;Last NUC MPI on 02/06/2009-ischemia;EF=57% Cardiac Risk Factors: Family History - CAD, History of Smoking, Hypertension and Lipids  Symptoms:  DOE, Fatigue and Palpitations   Nuclear Pre-Procedure Caffeine/Decaff Intake:  1:00am NPO After: 11am   IV Site: R Forearm  IV 0.9% NS with Angio Cath:  22g  Chest Size (in):  38"  IV Started by: Rolene Course , RN  Height: 5\' 6"  (1.676 m)  Cup Size: n/a  BMI:  Body mass index is 25.19 kg/(m^2). Weight:  156 lb (70.761 kg)   Tech Comments:  n/a    Nuclear Med Study 1 or 2 day study: 1 day  Stress Test Type:  Stress  Order Authorizing Provider:  Glenetta Hew, MD    Resting Radionuclide: Technetium 77m Sestamibi  Resting Radionuclide Dose: 9.8 mCi   Stress Radionuclide:  Technetium 49m Sestamibi  Stress Radionuclide Dose: 29.9 mCi           Stress Protocol Rest HR: 82 Stress HR: 141  Rest BP: 142/88 Stress BP: 180/85  Exercise Time (min): 10 METS: 10.4   Predicted Max HR: 160 bpm % Max HR: 88.12 bpm Rate Pressure Product: 25380  Dose of Adenosine (mg):  n/a Dose of Lexiscan: n/a mg  Dose of Atropine (mg): n/a Dose of Dobutamine: n/a mcg/kg/min (at max HR)  Stress Test Technologist: Leane Para, CCT Nuclear Technologist: Otho Perl, CNMT   Rest Procedure:  Myocardial perfusion imaging was performed at rest 45 minutes following the intravenous administration of Technetium 43m Sestamibi. Stress Procedure:  The patient performed treadmill exercise using a Bruce  Protocol for 10. minutes. The patient stopped due to  Fatigue, SOB and leg discomfort and denied any chest pain.  There were no significant ST-T wave changes.  Technetium 77m Sestamibi was injected IV at peak exercise and myocardial perfusion imaging was performed after a brief delay.  Transient Ischemic Dilatation (Normal <1.22):  0.81 Lung/Heart Ratio (Normal <0.45):  0.31 QGS EDV:  76 ml QGS ESV:  28 ml LV Ejection Fraction: 63%  .      Rest ECG: NSR - Normal EKG  Stress ECG: No significant change from baseline ECG  QPS Raw Data Images:  Normal; no motion artifact; normal heart/lung ratio. Stress Images:  There is decreased uptake in the anterior wall. Rest Images:  Normal homogeneous uptake in all areas of the myocardium. Subtraction (SDS):  These findings are consistent with ischemia.  Impression Exercise Capacity:  Good exercise capacity. BP Response:  Normal blood pressure response. Clinical Symptoms:  No significant symptoms noted. ECG Impression:  No significant ST segment change suggestive of ischemia. Comparison with Prior Nuclear Study: No significant change from previous study  Overall Impression:  Low risk stress nuclear study Mild antero apical ischemia. F/U with Dr. Ellyn Hack.  LV Wall Motion:  NL LV Function; NL Wall Motion   Lorretta Harp, MD  06/18/2013 5:04 PM

## 2013-06-19 ENCOUNTER — Encounter: Payer: Self-pay | Admitting: Cardiology

## 2013-06-20 ENCOUNTER — Ambulatory Visit (HOSPITAL_BASED_OUTPATIENT_CLINIC_OR_DEPARTMENT_OTHER): Payer: BC Managed Care – PPO | Admitting: Hematology and Oncology

## 2013-06-20 ENCOUNTER — Other Ambulatory Visit (HOSPITAL_BASED_OUTPATIENT_CLINIC_OR_DEPARTMENT_OTHER): Payer: BC Managed Care – PPO

## 2013-06-20 VITALS — BP 141/75 | HR 71 | Temp 98.1°F | Resp 18 | Ht 66.0 in | Wt 156.7 lb

## 2013-06-20 DIAGNOSIS — E039 Hypothyroidism, unspecified: Secondary | ICD-10-CM

## 2013-06-20 DIAGNOSIS — Z85819 Personal history of malignant neoplasm of unspecified site of lip, oral cavity, and pharynx: Secondary | ICD-10-CM

## 2013-06-20 DIAGNOSIS — C099 Malignant neoplasm of tonsil, unspecified: Secondary | ICD-10-CM

## 2013-06-20 LAB — BASIC METABOLIC PANEL (CC13)
ANION GAP: 10 meq/L (ref 3–11)
BUN: 14.2 mg/dL (ref 7.0–26.0)
CHLORIDE: 103 meq/L (ref 98–109)
CO2: 22 mEq/L (ref 22–29)
Calcium: 9.5 mg/dL (ref 8.4–10.4)
Creatinine: 1 mg/dL (ref 0.7–1.3)
GLUCOSE: 117 mg/dL (ref 70–140)
POTASSIUM: 4.1 meq/L (ref 3.5–5.1)
Sodium: 136 mEq/L (ref 136–145)

## 2013-06-20 LAB — CBC WITH DIFFERENTIAL/PLATELET
BASO%: 0.7 % (ref 0.0–2.0)
BASOS ABS: 0.1 10*3/uL (ref 0.0–0.1)
EOS%: 3 % (ref 0.0–7.0)
Eosinophils Absolute: 0.2 10*3/uL (ref 0.0–0.5)
HEMATOCRIT: 43.2 % (ref 38.4–49.9)
HEMOGLOBIN: 14.8 g/dL (ref 13.0–17.1)
LYMPH%: 15.9 % (ref 14.0–49.0)
MCH: 34.6 pg — AB (ref 27.2–33.4)
MCHC: 34.3 g/dL (ref 32.0–36.0)
MCV: 100.8 fL — ABNORMAL HIGH (ref 79.3–98.0)
MONO#: 1.1 10*3/uL — AB (ref 0.1–0.9)
MONO%: 14.3 % — ABNORMAL HIGH (ref 0.0–14.0)
NEUT#: 4.9 10*3/uL (ref 1.5–6.5)
NEUT%: 66.1 % (ref 39.0–75.0)
Platelets: 243 10*3/uL (ref 140–400)
RBC: 4.29 10*6/uL (ref 4.20–5.82)
RDW: 15.1 % — ABNORMAL HIGH (ref 11.0–14.6)
WBC: 7.4 10*3/uL (ref 4.0–10.3)
lymph#: 1.2 10*3/uL (ref 0.9–3.3)

## 2013-06-20 LAB — T4, FREE: Free T4: 1.49 ng/dL (ref 0.80–1.80)

## 2013-06-20 NOTE — Progress Notes (Signed)
Quick Note:  It would appear that this test has the same results as the one in 2011 that was "false positive". Cardiac cath confirmed patent grafts. What we are seeing is probably the same defect noted in 2011 that was probably an old infarct. With no angina, I see no reason to pursue cardiac catheterization.  The Treadmill portion was unremarkable.   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  Leonie Man, M.D., M.S. Interventional Cardiologist      ______

## 2013-06-20 NOTE — Progress Notes (Signed)
[facility] OFFICE PROGRESS NOTE  Patient Care Team: Horatio Pel, MD as PCP - General (Internal Medicine) Izora Gala, MD as Attending Physician (Otolaryngology) Marye Round, MD as Consulting Physician (Radiation Oncology) Heath Lark, MD as Consulting Physician (Hematology and Oncology)  SUMMARY OF ONCOLOGIC HISTORY: Oncology History   Tonsillar cancer, HPV positive   Primary site: Pharynx - Oropharynx (Left)   Staging method: AJCC 7th Edition   Clinical: Stage IVA (T2, N2, M0) signed by Heath Lark, MD on 12/17/2012  1:43 PM   Pathologic: Stage IVA (T2, N2, cM0) signed by Heath Lark, MD on 12/17/2012  1:43 PM   Summary: Stage IVA (T2, N2, cM0)  Tongue cancer, SCC, 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  A right paratracheal mildly enlarged, mildly hypermetabolic lymph node.     05/24/2010 - 07/08/2010 Chemotherapy Patient received high dose cisplatin with XRT   10/12/2010 Imaging PEt/Ct showed complete response to Rx   04/11/2012 Imaging Stable postradiation changes in the neck.  No recurrent mass or adenopathy    INTERVAL HISTORY: Please see below for problem oriented charting. He feels well. He complained of persistent dry mouth.  REVIEW OF SYSTEMS:   Constitutional: Denies fevers, chills or abnormal weight loss Eyes: Denies blurriness of vision Ears, nose, mouth, throat, and face: Denies mucositis or sore throat Respiratory: Denies cough, dyspnea or wheezes Cardiovascular: Denies palpitation, chest discomfort or lower extremity swelling Gastrointestinal:  Denies nausea, heartburn or change in bowel habits Skin: Denies abnormal skin rashes Lymphatics: Denies new lymphadenopathy or easy bruising Neurological:Denies  numbness, tingling or new weaknesses Behavioral/Psych: Mood is stable, no new changes  All other systems were reviewed with the patient and are negative.  I have reviewed the past medical history, past surgical history, social history and family history with the patient and they are unchanged from previous note.  ALLERGIES:  is allergic to codeine.  MEDICATIONS:  Current Outpatient Prescriptions  Medication Sig Dispense Refill  . amLODipine (NORVASC) 5 MG tablet Take 1 tablet (5 mg total) by mouth daily.  90 tablet  3  . aspirin 325 MG tablet Take 325 mg by mouth daily.      [name] atorvastatin (LIPITOR) 40 MG tablet Take 40 mg by mouth daily.      [name] esomeprazole (NEXIUM) 40 MG capsule Take 1 capsule (40 mg total) by mouth daily before breakfast.  90 capsule  3  . fish oil-omega-3 fatty acids 1000 MG capsule Take 1 g by mouth daily.      . folic acid (FOLVITE) 943 MCG tablet Take 400 mcg by mouth daily.      [name] levothyroxine (SYNTHROID, LEVOTHROID) 50 MCG tablet TAKE 1 TABLET BY MOUTH EVERY DAY  90 tablet  0  . lisinopril (PRINIVIL,ZESTRIL) 40 MG tablet Take 40 mg by mouth Daily.      . metoprolol tartrate (LOPRESSOR) 25 MG tablet Take 1 tablet (25 mg total) by mouth 2 (two) times daily.  180 tablet  3  . mirtazapine (REMERON) 15 MG tablet Take 15 mg by mouth at bedtime.      . Multiple Vitamins-Minerals (MULTIVITAMIN WITH MINERALS) tablet Take 1 tablet by mouth daily.      . niacin (NIASPAN) 500 MG CR tablet Take 1 tablet (500  mg total) by mouth at bedtime.  90 tablet  3   No current facility-administered medications for this visit.    PHYSICAL EXAMINATION: ECOG PERFORMANCE STATUS: 0 - Asymptomatic  Filed Vitals:   06/20/13 1421  BP: 141/75  Pulse: 71  Temp: 98.1 F (36.7 C)  Resp: 18   Filed Weights   06/20/13 1421  Weight: 156 lb 11.2 oz (71.079 kg)    GENERAL:alert, no distress and comfortable SKIN: skin color, texture, turgor are normal, no rashes or significant  lesions EYES: normal, Conjunctiva are pink and non-injected, sclera clear OROPHARYNX:no exudate, no erythema and lips, buccal mucosa, and tongue normal  NECK: Neck is thick from prior radiation changes.  LYMPH:  no palpable lymphadenopathy in the cervical, axillary or inguinal LUNGS: clear to auscultation and percussion with normal breathing effort HEART: regular rate & rhythm and no murmurs and no lower extremity edema ABDOMEN:abdomen soft, non-tender and normal bowel sounds Musculoskeletal:no cyanosis of digits and no clubbing  NEURO: alert & oriented x 3 with fluent speech, no focal motor/sensory deficits  LABORATORY DATA:  I have reviewed the data as listed    Component Value Date/Time   NA 136 06/20/2013 1402   NA 140 04/08/2011 1138   NA 137 10/13/2010 1141   K 4.1 06/20/2013 1402   K 4.3 04/08/2011 1138   K 3.9 10/13/2010 1141   CL 100 04/11/2012 1300   CL 97* 04/08/2011 1138   CL 101 10/13/2010 1141   CO2 22 06/20/2013 1402   CO2 27 04/08/2011 1138   CO2 25 10/13/2010 1141   GLUCOSE 117 06/20/2013 1402   GLUCOSE 139* 04/11/2012 1300   GLUCOSE 100 04/08/2011 1138   GLUCOSE 108* 10/13/2010 1141   BUN 14.2 06/20/2013 1402   BUN 14 04/08/2011 1138   BUN 9 10/13/2010 1141   CREATININE 1.0 06/20/2013 1402   CREATININE 1.1 04/08/2011 1138   CREATININE 1.03 10/13/2010 1141   CALCIUM 9.5 06/20/2013 1402   CALCIUM 9.4 04/08/2011 1138   CALCIUM 9.8 10/13/2010 1141   PROT 7.4 12/17/2012 1327   PROT 6.9 04/08/2011 1138   PROT 6.6 10/13/2010 1141   ALBUMIN 3.8 12/17/2012 1327   ALBUMIN 4.2 10/13/2010 1141   AST 26 12/17/2012 1327   AST 29 04/08/2011 1138   AST 20 10/13/2010 1141   ALT 14 12/17/2012 1327   ALT 24 04/08/2011 1138   ALT 12 10/13/2010 1141   ALKPHOS 128 12/17/2012 1327   ALKPHOS 93* 04/08/2011 1138   ALKPHOS 85 10/13/2010 1141   BILITOT 0.47 12/17/2012 1327   BILITOT 0.90 04/08/2011 1138   BILITOT 0.4 10/13/2010 1141   GFRNONAA >60 04/09/2010 1059   GFRAA  Value: >60        The eGFR has  been calculated using the MDRD equation. This calculation has not been validated in all clinical situations. eGFR's persistently <60 mL/min signify possible Chronic Kidney Disease. 04/09/2010 1059    No results found for this basename: SPEP, UPEP,  kappa and lambda light chains    Lab Results  Component Value Date   WBC 7.4 06/20/2013   NEUTROABS 4.9 06/20/2013   HGB 14.8 06/20/2013   HCT 43.2 06/20/2013   MCV 100.8* 06/20/2013   PLT 243 06/20/2013      Chemistry      Component Value Date/Time   NA 136 06/20/2013 1402   NA 140 04/08/2011 1138   NA 137 10/13/2010 1141   K 4.1 06/20/2013 1402   K 4.3 04/08/2011  1138   K 3.9 10/13/2010 1141   CL 100 04/11/2012 1300   CL 97* 04/08/2011 1138   CL 101 10/13/2010 1141   CO2 22 06/20/2013 1402   CO2 27 04/08/2011 1138   CO2 25 10/13/2010 1141   BUN 14.2 06/20/2013 1402   BUN 14 04/08/2011 1138   BUN 9 10/13/2010 1141   CREATININE 1.0 06/20/2013 1402   CREATININE 1.1 04/08/2011 1138   CREATININE 1.03 10/13/2010 1141      Component Value Date/Time   CALCIUM 9.5 06/20/2013 1402   CALCIUM 9.4 04/08/2011 1138   CALCIUM 9.8 10/13/2010 1141   ALKPHOS 128 12/17/2012 1327   ALKPHOS 93* 04/08/2011 1138   ALKPHOS 85 10/13/2010 1141   AST 26 12/17/2012 1327   AST 29 04/08/2011 1138   AST 20 10/13/2010 1141   ALT 14 12/17/2012 1327   ALT 24 04/08/2011 1138   ALT 12 10/13/2010 1141   BILITOT 0.47 12/17/2012 1327   BILITOT 0.90 04/08/2011 1138   BILITOT 0.4 10/13/2010 1141      ASSESSMENT & PLAN:  Tonsillar cancer The patient is recommended to stay away from drinking alcohol or smoking. I would discharge him from medical oncology clinic recommend close followup with ENT in the future.  Hypothyroid His most recent thyroid function tests were within normal limits. Recommend followup blood test with his primary care provider for medication adjustment in the future.  All questions were answered. The patient knows to call the clinic with any problems, questions or  concerns. No barriers to learning was detected. I spent 15 minutes counseling the patient face to face. The total time spent in the appointment was 20 minutes and more than 50% was on counseling and review of test results     Heath Lark, MD 06/20/2013 4:17 PM

## 2013-06-20 NOTE — Assessment & Plan Note (Signed)
His most recent thyroid function tests were within normal limits. Recommend followup blood test with his primary care provider for medication adjustment in the future.

## 2013-06-20 NOTE — Assessment & Plan Note (Signed)
The patient is recommended to stay away from drinking alcohol or smoking. I would discharge him from medical oncology clinic recommend close followup with ENT in the future.

## 2013-06-21 ENCOUNTER — Telehealth: Payer: Self-pay | Admitting: *Deleted

## 2013-06-21 LAB — TSH CHCC: TSH: 4.589 m(IU)/L — ABNORMAL HIGH (ref 0.320–4.118)

## 2013-06-21 NOTE — Telephone Encounter (Signed)
Message copied by Raiford Simmonds on Fri Jun 21, 2013  8:46 AM ------      Message from: Leonie Man      Created: Thu Jun 20, 2013  5:58 PM       It would appear that this test has the same results as the one in 2011 that was "false positive".  Cardiac cath confirmed patent grafts.  What we are seeing is probably the same defect noted in 2011 that was probably an old infarct.      With no angina, I see no reason to pursue cardiac catheterization.            The Treadmill portion was unremarkable.              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             Leonie Man, M.D., M.S.      Interventional Cardiologist                          ------

## 2013-06-21 NOTE — Telephone Encounter (Signed)
Left message to call back  

## 2013-06-24 ENCOUNTER — Telehealth: Payer: Self-pay | Admitting: *Deleted

## 2013-06-24 NOTE — Telephone Encounter (Signed)
Left VM for pt to return nurse's call when he has a chance about his lab results.

## 2013-06-24 NOTE — Telephone Encounter (Signed)
Message copied by Cathlean Cower on Mon Jun 24, 2013  5:25 PM ------      Message from: [facility], [place]      Created: Fri Jun 21, 2013 10:47 AM      Regarding: TSH       Mildly elevated, make sure he has it rechecked with PCP in 3 months      No change to thyroid med      ----- Message -----         From: [facility]         Sent: 06/20/2013   2:14 PM           To: Heath Lark, MD                   ------

## 2013-06-25 ENCOUNTER — Encounter: Payer: Self-pay | Admitting: *Deleted

## 2013-06-25 ENCOUNTER — Telehealth: Payer: Self-pay | Admitting: *Deleted

## 2013-06-25 NOTE — Telephone Encounter (Signed)
Spoke to patient. Result given . Verbalized understanding Letter sent with results to patient.

## 2013-06-25 NOTE — Telephone Encounter (Signed)
Called pt and left message on voice mail requesting a call back from pt about lab results.

## 2013-06-28 ENCOUNTER — Telehealth: Payer: Self-pay | Admitting: Cardiology

## 2013-06-28 NOTE — Telephone Encounter (Signed)
Need a new prescription for Atorvastatin 40 mg #90 please. Please call to [phone],pleasze call today if possible.

## 2013-07-01 MED ORDER — ATORVASTATIN CALCIUM 40 MG PO TABS: 40.0000 mg | ORAL_TABLET | Freq: Every day | ORAL | Status: DC

## 2013-07-01 NOTE — Telephone Encounter (Signed)
Rx was sent to pharmacy electronically. 

## 2013-07-24 NOTE — Telephone Encounter (Signed)
Encounter complete. 

## 2013-08-03 ENCOUNTER — Emergency Department: Payer: Self-pay | Admitting: Internal Medicine

## 2013-11-27 ENCOUNTER — Other Ambulatory Visit: Payer: Self-pay | Admitting: Cardiology

## 2013-11-27 NOTE — Telephone Encounter (Signed)
Rx was sent to pharmacy electronically. 

## 2014-02-21 ENCOUNTER — Other Ambulatory Visit: Payer: Self-pay | Admitting: Cardiology

## 2014-02-21 NOTE — Telephone Encounter (Signed)
Rx(s) sent to pharmacy electronically.  

## 2014-05-15 ENCOUNTER — Other Ambulatory Visit: Payer: Self-pay | Admitting: Cardiology

## 2014-05-15 NOTE — Telephone Encounter (Signed)
Rx(s) sent to pharmacy electronically.  

## 2014-08-09 ENCOUNTER — Other Ambulatory Visit: Payer: Self-pay | Admitting: Cardiology

## 2014-09-14 ENCOUNTER — Other Ambulatory Visit: Payer: Self-pay | Admitting: Cardiology

## 2014-09-15 NOTE — Telephone Encounter (Signed)
Rx(s) sent to pharmacy electronically.  

## 2014-09-19 ENCOUNTER — Encounter: Payer: Self-pay | Admitting: Cardiology

## 2014-09-19 ENCOUNTER — Ambulatory Visit (INDEPENDENT_AMBULATORY_CARE_PROVIDER_SITE_OTHER): Payer: BLUE CROSS/BLUE SHIELD | Admitting: Cardiology

## 2014-09-19 VITALS — BP 160/100 | HR 70 | Ht 66.0 in | Wt 154.0 lb

## 2014-09-19 DIAGNOSIS — R002 Palpitations: Secondary | ICD-10-CM

## 2014-09-19 DIAGNOSIS — I255 Ischemic cardiomyopathy: Secondary | ICD-10-CM | POA: Diagnosis not present

## 2014-09-19 DIAGNOSIS — E785 Hyperlipidemia, unspecified: Secondary | ICD-10-CM | POA: Diagnosis not present

## 2014-09-19 DIAGNOSIS — I10 Essential (primary) hypertension: Secondary | ICD-10-CM | POA: Diagnosis not present

## 2014-09-19 DIAGNOSIS — I2510 Atherosclerotic heart disease of native coronary artery without angina pectoris: Secondary | ICD-10-CM

## 2014-09-19 NOTE — Patient Instructions (Signed)
  KEEP MONITOR OF BLOOD PRESSURE GOAL IS  BELOW 140/80 ,IF BLOOD PRESSURE CONTINUE TO STAY ABOVE THIS NUMBER YOU MAY INCREASE AMLODIPINE TO 10 MG FROM 5 MG. CALL OFFICE SO WE MAY CALL YOU IN THE NEW PRESCRIPTION.  CONTINUE ALL OTHER MEDICATIONS.    Your physician wants you to follow-up in [place].  You will receive a reminder letter in the mail two months in advance. If you don't receive a letter, please call our office to schedule the follow-up appointment.

## 2014-09-19 NOTE — Progress Notes (Signed)
PCP: Horatio Pel, MD  Clinic Note: Chief Complaint  Patient presents with  . 17 months    Patient has no complaints. He is now retired.  . Coronary Artery Disease  . Hypertension    HPI: Mark Mendoza is a 64 y.o. male with a  with a cardiac history as noted below --   CABG back in 2000,   Cardiac catheterization back in 2011 for what looks to be a false positive stress test.   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   C5 - pinched nerve - neuropathy/radiculopathy.  KOUPER SPINELLA was last seen in April 2015 --> he was doing well at that time. 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 He was supposed to after return followup visit in 3 months, this never happened.  Recent Hospitalizations: n/a  Studies Reviewed:   Nuc Myoview 05/2013: same results as the one in 2011 that was "false positive". Cardiac cath confirmed patent grafts. What we are seeing is probably the same defect noted in 2011 that was probably an old infarct. With no angina, I see no reason to pursue cardiac catheterization. LV Ejection Fraction: 63% The Treadmill portion was unremarkable.   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  Interval History: he presents today really without any major complaints. He does not do a lot of exercise, but he really hasn't been doing much other activity either. He recently has been helping his son do some house remodeling but otherwise has not been doing much activity determine if he is having any exertional dyspnea or not. It would be not effective he is doing he denies any resting or exertional chest tightness/pressure or dyspnea. He is not noticing any heart Failure  symptoms of PND, orthopnea or edema.  Cardiovascular ROS: no chest pain or dyspnea on exertion negative for - edema, irregular heartbeat, loss of consciousness, orthopnea, palpitations, paroxysmal nocturnal dyspnea, rapid heart rate, shortness of breath or near syncope, TIA/Amaurosis Fugax.  Past Medical History  Diagnosis Date  . Tonsillar cancer dx'd 04/2010    Treated with radiation and chemotherapy. Competent for significant weight loss.  . Hypertension   . CAD S/P percutaneous coronary angioplasty Prior to 2000    Previous stent in LAD with ISR leading to CABG. Last cath 2011 for ? false + TM Myoview  . CAD (coronary artery disease), native coronary artery     s/p CABG x 3 for prox disesae in native vessels.  . Dyslipidemia, goal LDL below 70   . Ischemic cardiomyopathy      EF of roughly 40-45% by cath.no echogram done.   . Hypothyroidism 04/08/2011  . GERD (gastroesophageal reflux disease)   . History of stress test 01/2009    Exercise myocardial perfusion imageing study with a small to moderate sized region of ischemia noted in the mid to apical anterior wall. There is scar noted in the apical region. The post stress left ventricle is normal in size.Abnormal Myocardial perfusion study..    ROS: A comprehensive was performed. Review of Systems  Constitutional: Negative for malaise/fatigue (Just does not feel like getting off of the couch yet - still  enjoying retirement &being home.).  Respiratory: Negative for cough (occasional annoying cough).   Cardiovascular: Negative for claudication.  Gastrointestinal: Negative for blood in stool and melena.  Musculoskeletal: Negative.  Negative for myalgias.  Endo/Heme/Allergies: Does not bruise/bleed easily.  Psychiatric/Behavioral: Negative.   All other systems reviewed and are negative.   Past Surgical History  Procedure Laterality Date  . Coronary artery bypass graft  2000    X3: LIMA-LAD, SVG-OM, SVG-RPDA. (Done for severe LAD  in-stent restenosis, 60% OM1 and significant RCA disease.  . Cardiac catheterization  2011    Done for abnormal Myoview: Patent grafts. Proximal left main disease subtotaled LAD after the stent with significant ISR. Small circumflex vessel with 60% OM lesion prior to graft insertion. 100% proximal RCA occlusion.  [name] Nm myoview ltd  2011; 07/2013    a) Question of small to moderate mid anterior defect with reversibility -- no lesion to explain. False positive;; b) EF 63%, same findings as noted on prior "false + study" -most consistent with prior infarct & mild ischemia.  med Rx    Prior to Admission medications   Medication Sig Start Date End Date Taking? Authorizing Provider  amLODipine (NORVASC) 5 MG tablet TAKE 1 TABLET BY MOUTH DAILY. 09/15/14  Yes Leonie Man, MD  aspirin 325 MG tablet Take 325 mg by mouth daily.   Yes Historical Provider, MD  atorvastatin (LIPITOR) 40 MG tablet Take 1 tablet (40 mg total) by mouth daily. 07/01/13  Yes Leonie Man, MD  fish oil-omega-3 fatty acids 1000 MG capsule Take 1 g by mouth daily.   Yes Historical Provider, MD  levothyroxine (SYNTHROID, LEVOTHROID) 50 MCG tablet TAKE 1 TABLET BY MOUTH EVERY DAY 11/15/12  Yes Heath Lark, MD  lisinopril (PRINIVIL,ZESTRIL) 40 MG tablet Take 40 mg by mouth Daily. 08/26/11  Yes Historical Provider, MD  metoprolol tartrate (LOPRESSOR) 25 MG tablet TAKE 1 TABLET BY MOUTH 2 TIMES DAILY. <PLEASE MAKE APPOINTMENT FOR REFILLS> 08/11/14  Yes Leonie Man, MD  mirtazapine (REMERON) 15 MG tablet Take 15 mg by mouth at bedtime. 08/31/11  Yes Historical Provider, MD  Multiple Vitamins-Minerals (MULTIVITAMIN WITH MINERALS) tablet Take 1 tablet by mouth daily.   Yes Historical Provider, MD  niacin (NIASPAN) 500 MG CR tablet Take 1 tablet (500 mg total) by mouth at bedtime. <please make appointment for refills> 02/21/14  Yes Leonie Man, MD  ranitidine (ZANTAC) 150 MG tablet Take 150 mg by mouth daily. 09/14/14  Yes Historical  Provider, MD   Allergies  Allergen Reactions  . Codeine Other (See Comments)    Jitters    Social History   Social History  . Marital Status: Divorced    Spouse Name: N/A  . Number of Children: N/A  . Years of Education: N/A   Social History Main Topics  . Smoking status: Former Smoker    Quit date: 08/15/2010  . Smokeless tobacco: None  . Alcohol Use: 0.6 oz/week    1 Shots of liquor per week     Comment: beers  . Drug Use: No  . Sexual Activity: Not Asked   Other Topics Concern  . 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 His divorced father of 3 stop smoking in March 2012. He has occasional alcohol.   Family History  Problem  Relation Age of Onset  . Cancer Father     unknown  . Pneumonia Father   . Diabetes Mother   . CAD Mother   . CAD Sister     Wt Readings from Last 3 Encounters:  09/19/14 154 lb (69.854 kg)  06/20/13 156 lb 11.2 oz (71.079 kg)  06/18/13 156 lb (70.761 kg)    PHYSICAL EXAM BP 160/100 mmHg  Pulse 70  Ht 5\' 6"  (1.676 m)  Wt 154 lb (69.854 kg)  BMI 24.87 kg/m2  He tells me he knows blood pressure usually runs in the 120/16mmHg 6 range. This will be checked @ PCP's office in the near future to compare.  General appearance: alert, cooperative, appears stated age, no distress and Healthy-appearing reviewed well-nourished and well-groomed. Answers questions appropriately.  Neck: no carotid bruit and no JVD Lungs: CTAB, normal percussion bilaterally and Nonlabored, good air movement. Heart: RRR, S1, S2 normal, no murmur, click, rub or gallop and normal apical impulse Abdomen: soft, non-tender; bowel sounds normal; no masses, no organomegaly Extremities: extremities normal, atraumatic, no cyanosis or edema Pulses: 2+ and symmetric Neurologic: Grossly normal   Adult ECG Report   Rate: 70 ;  Rhythm: normal sinus rhythm;  Poorly progression suggestive of septal MI, age undetermined. No change from previous the size heart ratedecreased since last visit.    Other studies Reviewed: Additional studies/ records that were reviewed today include:  Recent Labs:  None available -- he is due to see his PCP shortly to get labs checked then. Since   ASSESSMENT / PLAN: Problem List Items Addressed This Visit    CAD in native artery - s/p PTCA, then CABG (Chronic)    No recurrent anginal symptoms or palpations. He had essentially negative Myoview last year. Continue aspirin, statin, ACE inhibitor, beta blocker and amlodipine.      Relevant Orders   EKG 12-Lead   Dyslipidemia, goal LDL below 70 - Primary (Chronic)    E. Is on a statin. Labs monitored by PCP. Pending evaluation soon.      Relevant Orders   EKG 12-Lead   Essential hypertension (Chronic)    Blood pressure does look better today, but has been according to his report we'll go home. Will need close followup at PCPs office. If he continues to be elevated would recommend increasing amlodipine 10 mg. I've asked that he follow his blood pressure at home, to provide data to his PCP. We also talked about the possibility of switching him from Lopressor to carvedilol. This would be the next option after increasing amlodipine.      Relevant Orders   EKG 12-Lead   Intermittent palpitations    Palpitations don't seem to be bothering him much on low-dose beta blocker.      Ischemic cardiomyopathy mild to moderate (Chronic)    EF looked relatively normal bilaterally Myoview. Not having any active heart failure symptoms. No PND orthopnea or edema. He is on beta blocker and ACE inhibitor, however as in dictated note like to switch from Lopressor to Toprol or carvedilol.      Relevant Orders   EKG 12-Lead      Current medicines are reviewed at length with the patient today. (+/- concerns) reluctant data new blood  pressure medications. The following changes have been made: KEEP MONITOR OF BLOOD PRESSURE GOAL IS  BELOW 140/80 ,IF BLOOD PRESSURE CONTINUE TO STAY ABOVE THIS NUMBER YOU MAY INCREASE AMLODIPINE TO 10 MG FROM 5 MG. CALL OFFICE SO WE MAY  CALL YOU IN THE NEW PRESCRIPTION.  CONTINUE ALL OTHER MEDICATIONS.    Your physician wants you to follow-up in [place].    Studies Ordered:   Orders Placed This Encounter  Procedures  . EKG 12-Lead     Leonie Man, M.D., M.S. Interventional Cardiologist   Pager # [phone]

## 2014-09-21 ENCOUNTER — Encounter: Payer: Self-pay | Admitting: Cardiology

## 2014-09-21 NOTE — Assessment & Plan Note (Signed)
No recurrent anginal symptoms or palpations. He had essentially negative Myoview last year. Continue aspirin, statin, ACE inhibitor, beta blocker and amlodipine.

## 2014-09-21 NOTE — Assessment & Plan Note (Signed)
Palpitations don't seem to be bothering him much on low-dose beta blocker.

## 2014-09-21 NOTE — Assessment & Plan Note (Signed)
Blood pressure does look better today, but has been according to his report we'll go home. Will need close followup at PCPs office. If he continues to be elevated would recommend increasing amlodipine 10 mg. I've asked that he follow his blood pressure at home, to provide data to his PCP. We also talked about the possibility of switching him from Lopressor to carvedilol. This would be the next option after increasing amlodipine.

## 2014-09-21 NOTE — Assessment & Plan Note (Signed)
E. Is on a statin. Labs monitored by PCP. Pending evaluation soon.

## 2014-09-21 NOTE — Assessment & Plan Note (Signed)
EF looked relatively normal bilaterally Myoview. Not having any active heart failure symptoms. No PND orthopnea or edema. He is on beta blocker and ACE inhibitor, however as in dictated note like to switch from Lopressor to Toprol or carvedilol.

## 2014-11-24 ENCOUNTER — Other Ambulatory Visit: Payer: Self-pay | Admitting: Internal Medicine

## 2014-11-24 DIAGNOSIS — I25119 Atherosclerotic heart disease of native coronary artery with unspecified angina pectoris: Secondary | ICD-10-CM

## 2014-11-27 ENCOUNTER — Encounter: Payer: Self-pay | Admitting: Cardiology

## 2014-12-03 ENCOUNTER — Other Ambulatory Visit: Payer: Self-pay | Admitting: Internal Medicine

## 2014-12-03 ENCOUNTER — Ambulatory Visit
Admission: RE | Admit: 2014-12-03 | Discharge: 2014-12-03 | Disposition: A | Discharge status: Home / Self Care | Payer: BLUE CROSS/BLUE SHIELD | Source: Ambulatory Visit | Attending: Internal Medicine | Admitting: Internal Medicine

## 2014-12-03 DIAGNOSIS — I25119 Atherosclerotic heart disease of native coronary artery with unspecified angina pectoris: Secondary | ICD-10-CM

## 2014-12-24 ENCOUNTER — Other Ambulatory Visit: Payer: Self-pay | Admitting: Cardiology

## 2014-12-24 NOTE — Telephone Encounter (Signed)
REFILL 

## 2015-01-12 ENCOUNTER — Other Ambulatory Visit: Payer: Self-pay | Admitting: Cardiology

## 2015-06-25 ENCOUNTER — Encounter: Payer: Self-pay | Admitting: Cardiology

## 2015-08-06 ENCOUNTER — Other Ambulatory Visit: Payer: Self-pay

## 2015-08-06 MED ORDER — AMLODIPINE BESYLATE 5 MG PO TABS: 5.0000 mg | ORAL_TABLET | Freq: Every day | ORAL | Status: DC

## 2015-10-09 ENCOUNTER — Encounter: Payer: Self-pay | Admitting: Cardiology

## 2015-10-09 ENCOUNTER — Ambulatory Visit (INDEPENDENT_AMBULATORY_CARE_PROVIDER_SITE_OTHER): Payer: BLUE CROSS/BLUE SHIELD | Admitting: Cardiology

## 2015-10-09 VITALS — BP 122/84 | HR 70 | Ht 66.0 in | Wt 163.0 lb

## 2015-10-09 DIAGNOSIS — E785 Hyperlipidemia, unspecified: Secondary | ICD-10-CM

## 2015-10-09 DIAGNOSIS — I10 Essential (primary) hypertension: Secondary | ICD-10-CM

## 2015-10-09 DIAGNOSIS — I2510 Atherosclerotic heart disease of native coronary artery without angina pectoris: Secondary | ICD-10-CM | POA: Diagnosis not present

## 2015-10-09 DIAGNOSIS — I255 Ischemic cardiomyopathy: Secondary | ICD-10-CM

## 2015-10-09 NOTE — Patient Instructions (Signed)
NO CHANGES WITH CURRENT MEDICATIONS   Your physician wants you to follow-up in 12 MONTHS WITH DR HARDING. You will receive a reminder letter in the mail two months in advance. If you don't receive a letter, please call our office to schedule the follow-up appointment.   If you need a refill on your cardiac medications before your next appointment, please call your pharmacy.  

## 2015-10-09 NOTE — Progress Notes (Signed)
PCP: Horatio Pel, MD  Clinic Note: Chief Complaint  Patient presents with  . Follow-up    1 year  . Coronary Artery Disease    s/p CABG     HPI: Mark Mendoza is a 65 y.o. male with a  with a cardiac history as noted below --   CABG back in 2000,   Cardiac catheterization back in 2011 for what looks to be a false positive stress test.   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  Last Myoview 05/2013 following - Showed similar defect is noted in 2011. No ischemia with EF 63%.   He recently had a sleep study that was found to be okay.   C5 - pinched nerve - neuropathy/radiculopathy.  MACUS DILLE was last seen in Aug 2016--> he was doing well at that time.   Recent Hospitalizations: n/a  Studies Reviewed:   n/a  Interval History: He presents today really without any major complaints. He started to get back into doing more exercise and activity now that he is getting comfortable with his retired life. He has lots of job is doing around the house and helping out his family. He denies any active anginal symptoms now heart failure symptoms of PND, orthopnea or edema. He states that his palpitations have improved significantly -- much less than he used to have.  Cardiovascular ROS: no chest pain or dyspnea on exertion positive for - palpitations and These are much better. negative for - edema, irregular heartbeat, loss of consciousness, orthopnea, paroxysmal nocturnal dyspnea, rapid heart rate, shortness of breath or near syncope, TIA/Amaurosis Fugax.  PAD Screen 10/10/2015  Previous PAD dx? No  Previous surgical procedure? No  Pain with walking? No  Feet/toe relief with dangling? No  Painful, non-healing ulcers? No  Extremities discolored? No    Previous stent in LAD with ISR leading to CABG. Last cath 2011 for ? false + TM  Myoview ROS: A comprehensive was performed. Review of Systems  Constitutional: Negative for malaise/fatigue (Just does not feel like getting off of the couch yet - still enjoying retirement &being home.).  Respiratory: Negative for cough (occasional annoying cough).   Cardiovascular: Negative for claudication.  Gastrointestinal: Negative for blood in stool and melena.  Musculoskeletal: Negative.  Negative for myalgias.  Endo/Heme/Allergies: Does not bruise/bleed easily.  Psychiatric/Behavioral: Negative.   All other systems reviewed and are negative.  Past Medical History:  Diagnosis Date  . CAD (coronary artery disease), native coronary artery    Previous stent in LAD with ISR leading to CABG x 3 for prox disesae in native vessels.  Last cath 2011 for ? false + TM Myoview   . Dyslipidemia, goal LDL below 70   . GERD (gastroesophageal reflux disease)   . History of stress test 01/2009   Exercise myocardial perfusion imageing study with a small to moderate sized region of ischemia noted in the mid to apical anterior wall. There is scar noted in the apical region. The post stress left ventricle is normal in size.Abnormal Myocardial perfusion study..  . Hypertension   . Hypothyroidism 04/08/2011  . Ischemic cardiomyopathy     EF of roughly 40-45% by cath.no echogram done.   . Tonsillar cancer ([place]) dx'd 04/2010   Treated with radiation and chemotherapy. Competent for significant weight loss.    Past Surgical History:  Procedure Laterality Date  . CARDIAC CATHETERIZATION  2011   Done for abnormal Myoview: Patent grafts. Proximal left main disease subtotaled LAD after the stent with significant ISR. Small circumflex vessel with 60% OM lesion prior to graft insertion. 100% proximal RCA occlusion.  . CORONARY ARTERY BYPASS GRAFT  2000   X3: LIMA-LAD, SVG-OM, SVG-RPDA. (Done for severe LAD in-stent restenosis, 60% OM1 and significant RCA disease.  [name] NM MYOVIEW LTD  2011; 07/2013   a) Question  of small to moderate mid anterior defect with reversibility -- no lesion to explain. False positive;; b) EF 63%, same findings as noted on prior "false + study" -most consistent with prior infarct & mild ischemia.  med Rx     Prior to Admission medications   Medication Sig Start Date Taking? Authorizing Provider  amLODipine (NORVASC) 5 MG tablet Take 1 tablet (5 mg total) by mouth daily. 08/06/15 Yes Leonie Man, MD  aspirin 325 MG tablet Take 325 mg by mouth daily.  Yes Historical Provider, MD  atorvastatin (LIPITOR) 40 MG tablet TAKE 1 TABLET (40 MG TOTAL) BY MOUTH DAILY. 01/13/15 Yes Leonie Man, MD  fish oil-omega-3 fatty acids 1000 MG capsule Take 1 g by mouth daily.  Yes Historical Provider, MD  levothyroxine (SYNTHROID, LEVOTHROID) 50 MCG tablet TAKE 1 TABLET BY MOUTH EVERY DAY 11/15/12 Yes Heath Lark, MD  lisinopril (PRINIVIL,ZESTRIL) 40 MG tablet Take 40 mg by mouth Daily. 08/26/11 Yes Historical Provider, MD  metoprolol tartrate (LOPRESSOR) 25 MG tablet TAKE 1 TABLET BY MOUTH 2 TIMES DAILY. <PLEASE MAKE APPOINTMENT FOR REFILLS> 12/24/14 Yes Leonie Man, MD  mirtazapine (REMERON) 15 MG tablet Take 15 mg by mouth at bedtime. 08/31/11 Yes Historical Provider, MD  Multiple Vitamins-Minerals (MULTIVITAMIN WITH MINERALS) tablet Take 1 tablet by mouth daily.  Yes Historical Provider, MD  niacin (NIASPAN) 500 MG CR tablet Take 1 tablet (500 mg total) by mouth at bedtime. 01/13/15 Yes Leonie Man, MD  ranitidine (ZANTAC) 150 MG tablet Take 150 mg by mouth daily. 09/14/14 Yes Historical Provider, MD    Allergies  Allergen Reactions  . Codeine Other (See Comments)    Jitters    Social History   Social History  . Marital status: Divorced    Spouse name: N/A  . Number of children: N/A  . Years of education: N/A   Social History Main Topics  . Smoking status: Former Smoker    Quit date: 08/15/2010  . Smokeless tobacco: Never Used  . Alcohol use 0.6 oz/week    1 Shots of  liquor per week     Comment: beers  . Drug use: No  . Sexual activity: Not Asked   Other Topics Concern  . 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 His divorced father of 3 stop smoking in March 2012. He has occasional alcohol.   Family History  Problem Relation Age of Onset  . Diabetes Mother   . CAD Mother   . Cancer Father     unknown  . Pneumonia Father   . CAD Sister     Wt Readings from Last 3 Encounters:  10/09/15 163 lb (73.9 kg)  09/19/14 154 lb (69.9 kg)  06/20/13 156 lb 11.2 oz (71.1 kg)    PHYSICAL EXAM BP 122/84   Pulse 70  Ht 5\' 6"  (1.676 m)   Wt 163 lb (73.9 kg)   BMI 26.31 kg/m   General appearance: alert, cooperative, appears stated age, no distress and Healthy-appearing reviewed well-nourished and well-groomed. Answers questions appropriately.  Neck: no carotid bruit and no JVD Lungs: CTAB, normal percussion bilaterally and Nonlabored, good air movement. Heart: RRR, S1, S2 normal, no murmur, click, rub or gallop and normal apical impulse Abdomen: soft, non-tender; bowel sounds normal; no masses, no organomegaly Extremities: extremities normal, atraumatic, no cyanosis or edema Pulses: 2+ and symmetric Neurologic: Grossly normal   Adult ECG Report  Rate: 70 ;  Rhythm: normal sinus rhythm;  Poor R wave progression suggestive of septal MI, age undetermined. No change from previous   Other studies Reviewed: Additional studies/ records that were reviewed today include:  Recent Labs:  None available -- he is due to see his PCP shortly to get labs checked then.  Last available 04/2015: TC 145, TG 51, HDL 82, LDL 52. (Ratio 0.6)   ASSESSMENT / PLAN: Problem List Items Addressed This Visit    Ischemic cardiomyopathy mild to moderate (Chronic)    EF by Myoview looked almost back to normal. He  does not like somebody was reduced ejection fraction. No heart failure symptoms. He is on stable dose of beta blocker and ACE inhibitor.      Relevant Orders   EKG 12-Lead   Essential hypertension (Chronic)    Blood pressure looks good today. I think we'll continue his current dose of amlodipine along with lisinopril and metoprolol.      Relevant Orders   EKG 12-Lead   Dyslipidemia, goal LDL below 70 (Chronic)    Remains on atorvastatin, niacin and omega-3 fatty acids. Labs as of April 2017 that he brought in and are scanned looked great. No change for now. We can probably consider stopping niacin if he would be interested      Relevant Orders   EKG 12-Lead   CAD in native artery - s/p PTCA, then CABG - Primary (Chronic)    No recurrent anginal symptoms. Nonischemic Myoview in 2015. Would not be due for another one until 2019.  Plan: Continue aspirin, statin, beta blocker, ACE inhibitor and amlodipine.      Relevant Orders   EKG 12-Lead    Other Visit Diagnoses   None.     Current medicines are reviewed at length with the patient today. (+/- concerns) reluctant data new blood pressure medications. The following changes have been made: n/a  CONTINUE ALL OTHER MEDICATIONS.    Your physician wants you to follow-up in [place].    Studies Ordered:   Orders Placed This Encounter  Procedures  . EKG 12-Lead     Glenetta Hew, M.D., M.S. Interventional Cardiologist   Pager # [phone]

## 2015-10-10 ENCOUNTER — Encounter: Payer: Self-pay | Admitting: Cardiology

## 2015-10-10 NOTE — Assessment & Plan Note (Signed)
Blood pressure looks good today. I think we'll continue his current dose of amlodipine along with lisinopril and metoprolol.

## 2015-10-10 NOTE — Assessment & Plan Note (Signed)
No recurrent anginal symptoms. Nonischemic Myoview in 2015. Would not be due for another one until 2019.  Plan: Continue aspirin, statin, beta blocker, ACE inhibitor and amlodipine.

## 2015-10-10 NOTE — Assessment & Plan Note (Signed)
Remains on atorvastatin, niacin and omega-3 fatty acids. Labs as of April 2017 that he brought in and are scanned looked great. No change for now. We can probably consider stopping niacin if he would be interested

## 2015-10-10 NOTE — Assessment & Plan Note (Signed)
EF by Myoview looked almost back to normal. He does not like somebody was reduced ejection fraction. No heart failure symptoms. He is on stable dose of beta blocker and ACE inhibitor.

## 2015-11-23 IMAGING — US US AORTA
1 series · 2 of 2 positions shown · non-contrast
Comparison: PET-CT study October 12, 2010

CLINICAL DATA: History of coronary artery disease, long-term
smoker.

EXAM:
ULTRASOUND OF ABDOMINAL AORTA
TECHNIQUE: Ultrasound examination of the abdominal aorta was performed to
evaluate for abdominal aortic aneurysm.

[Series 1: us aorta · 0.19mm/px · 2 of 2 slices shown]
[im 1/2]
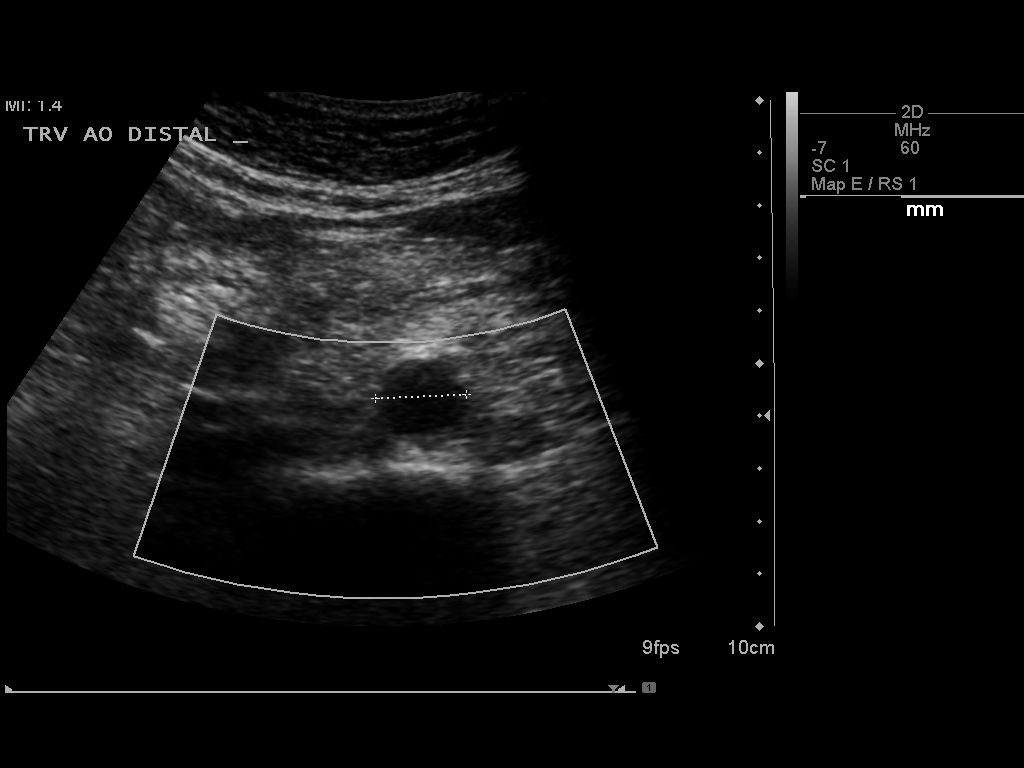
[im 2/2]
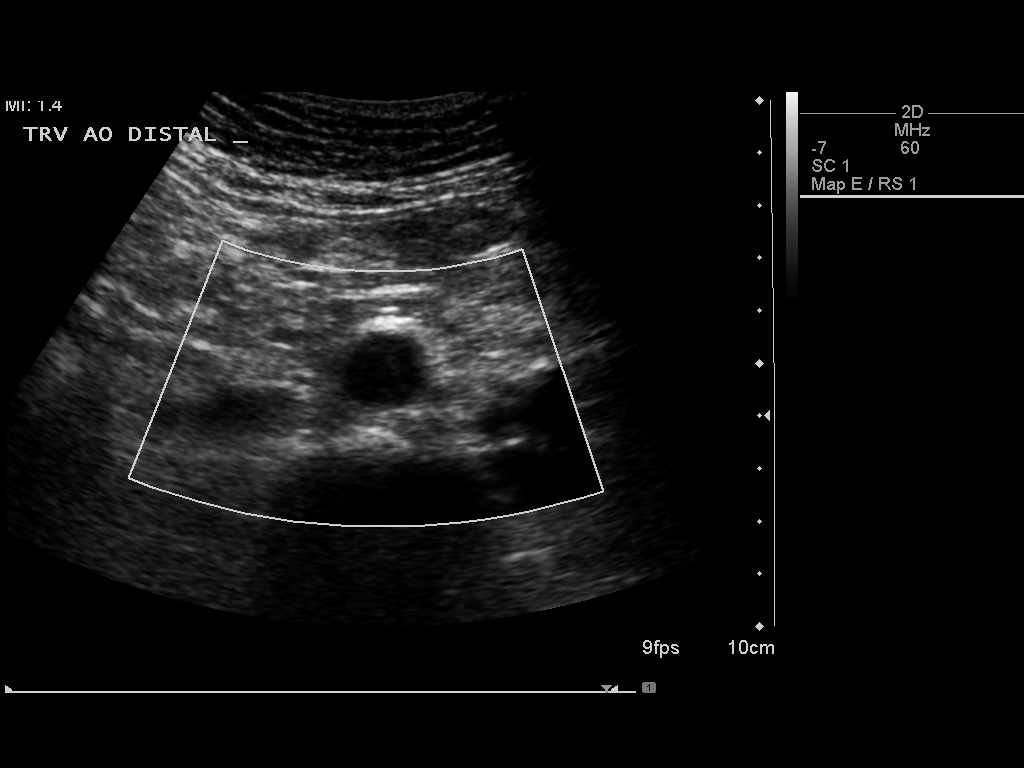

[2 of 2 positions shown; findings below may reference images not displayed]

FINDINGS: Abdominal Aorta

No aneurysm identified.

Proximal aortic dimensions:  2.2 cm AP x 1.8 cm transversely.

Mid aortic dimensions:  2.0 cm AP x 1.7 cm transversely.

Distal aortic dimensions:  1.7 cm AP x 1.7 cm transversely.

Right common iliac artery maximal dimension 1 cm. The left common
iliac artery maximal dimension is 1.1 cm.

Maximum Diameter of the abdominal aorta occurs proximally and is:
2.2 cm
IMPRESSION: There is no abdominal aortic aneurysm.

## 2016-01-14 ENCOUNTER — Other Ambulatory Visit: Payer: Self-pay | Admitting: Cardiology

## 2016-02-17 ENCOUNTER — Other Ambulatory Visit: Payer: Self-pay | Admitting: Cardiology

## 2016-02-18 NOTE — Telephone Encounter (Signed)
Rx(s) sent to pharmacy electronically.  

## 2016-05-10 ENCOUNTER — Other Ambulatory Visit: Payer: Self-pay | Admitting: Cardiology

## 2016-05-15 ENCOUNTER — Other Ambulatory Visit: Payer: Self-pay | Admitting: Cardiology

## 2016-10-31 ENCOUNTER — Ambulatory Visit (INDEPENDENT_AMBULATORY_CARE_PROVIDER_SITE_OTHER): Payer: BLUE CROSS/BLUE SHIELD | Admitting: Cardiology

## 2016-10-31 ENCOUNTER — Encounter: Payer: Self-pay | Admitting: Cardiology

## 2016-10-31 VITALS — BP 157/85 | HR 80 | Ht 67.0 in | Wt 161.4 lb

## 2016-10-31 DIAGNOSIS — R002 Palpitations: Secondary | ICD-10-CM

## 2016-10-31 DIAGNOSIS — I2510 Atherosclerotic heart disease of native coronary artery without angina pectoris: Secondary | ICD-10-CM | POA: Diagnosis not present

## 2016-10-31 DIAGNOSIS — E785 Hyperlipidemia, unspecified: Secondary | ICD-10-CM | POA: Diagnosis not present

## 2016-10-31 DIAGNOSIS — I10 Essential (primary) hypertension: Secondary | ICD-10-CM | POA: Diagnosis not present

## 2016-10-31 DIAGNOSIS — I255 Ischemic cardiomyopathy: Secondary | ICD-10-CM | POA: Diagnosis not present

## 2016-10-31 MED ORDER — NIACIN ER (ANTIHYPERLIPIDEMIC) 500 MG PO TBCR: 500.0000 mg | EXTENDED_RELEASE_TABLET | Freq: Every day | ORAL | 3 refills | Status: DC

## 2016-10-31 NOTE — Progress Notes (Signed)
PCP: Deland Pretty, MD  Clinic Note: Chief Complaint  Patient presents with  . Follow-up  . Coronary Artery Disease    HPI: Mark Mendoza is a 66 y.o. male with a  with a cardiac history as noted below who returns for annual follow-up   CABG back in 2000,   Cardiac catheterization back in 2011 for what looks to be a false positive stress test.   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  Last Myoview 05/2013 following - Showed similar defect is noted in 2011. No ischemia with EF 63%.   He recently had a sleep study that was found to be okay.   C5 - pinched nerve - neuropathy/radiculopathy.  Mark Mendoza was last seen in September 2017--> he was doing well at that time.   Recent Hospitalizations: n/a  Studies Reviewed:   n/a  Interval History: Mark Mendoza returns today overall doing quite well from a cardiac standpoint. For instance, he spent 3 hours outside yesterday using a chainsaw without any fatigue or dyspnea, chest tightness or pressure. He is back to full activity with no major complaints. He says at home his blood pressure run much better than they do hear (last checked this morning was 125/80 mmHg -- he checks it a couple times a day.). He has rare occasional palpitations but nothing prolonged.  Overall, he remains quite active with no major cardiac issues. 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  Cardiovascular ROS: no chest pain or dyspnea on exertion positive for - palpitations and These are much better. negative for - edema, irregular heartbeat, loss of consciousness, orthopnea, paroxysmal nocturnal dyspnea, rapid heart rate, shortness of breath or near syncope, TIA/Amaurosis Fugax.   ROS: A comprehensive was performed. Review of Systems  Constitutional: Negative for malaise/fatigue (still enjoying  retirement &being home, but is now out and about getting plenty of exercise).  HENT: Negative for congestion and nosebleeds.   Respiratory: Negative for cough (occasional annoying cough) and shortness of breath.   Cardiovascular: Negative for claudication.  Gastrointestinal: Negative for blood in stool, heartburn and melena.  Genitourinary: Negative for dysuria, frequency and hematuria.  Musculoskeletal: Positive for joint pain (Knees and hips). Negative for myalgias.  Neurological: Negative for focal weakness.  Endo/Heme/Allergies: Negative for environmental allergies. Does not bruise/bleed easily.  Psychiatric/Behavioral: Negative.  Negative for memory loss. The patient is not nervous/anxious.   All other systems reviewed and are negative.   Past Medical History:  Diagnosis Date  . CAD (coronary artery disease), native coronary artery    Previous stent in LAD with ISR leading to CABG x 3 for prox disesae in native vessels.  Last cath 2011 for ? false + TM Myoview   . Dyslipidemia, goal LDL below 70   . GERD (gastroesophageal reflux disease)   . History of stress test 01/2009   Exercise myocardial perfusion imageing study with a small to moderate sized region of ischemia noted in the mid to apical anterior wall. There is scar noted in the apical region. The post stress left ventricle is normal in size.Abnormal Myocardial perfusion study..  . Hypertension   . Hypothyroidism 04/08/2011  . Ischemic cardiomyopathy     EF of roughly 40-45% by cath.no echogram done.   . 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 Competent for significant weight loss.    Past Surgical History:  Procedure Laterality Date  . CARDIAC CATHETERIZATION  2011   Done for abnormal Myoview: Patent grafts. Proximal left main disease subtotaled LAD after the stent with significant ISR. Small circumflex vessel with 60% OM lesion prior to graft insertion. 100% proximal RCA occlusion.  .  CORONARY ARTERY BYPASS GRAFT  2000   X3: LIMA-LAD, SVG-OM, SVG-RPDA. (Done for severe LAD in-stent restenosis, 60% OM1 and significant RCA disease.  Mark Mendoza NM MYOVIEW LTD  2011; 07/2013   a) Question of small to moderate mid anterior defect with reversibility -- no lesion to explain. False positive;; b) EF 63%, same findings as noted on prior "false + study" -most consistent with prior infarct & mild ischemia.  med Rx    Current Meds  Medication Sig  . amLODipine (NORVASC) 5 MG tablet TAKE 1 TABLET BY MOUTH EVERY DAY  . aspirin 325 MG tablet Take 325 mg by mouth daily.  Mark Mendoza atorvastatin (LIPITOR) 40 MG tablet TAKE 1 TABLET (40 MG TOTAL) BY MOUTH DAILY.  Mark Mendoza esomeprazole (NEXIUM) 20 MG capsule Take 20 mg by mouth daily at 12 noon.  . fish oil-omega-3 fatty acids 1000 MG capsule Take 1 g by mouth daily.  Mark Mendoza levothyroxine (SYNTHROID, LEVOTHROID) 50 MCG tablet TAKE 1 TABLET BY MOUTH EVERY DAY  . lisinopril (PRINIVIL,ZESTRIL) 40 MG tablet Take 40 mg by mouth Daily.  . metoprolol tartrate (LOPRESSOR) 25 MG tablet TAKE 1 TABLET BY MOUTH 2 TIMES DAILY. <PLEASE MAKE APPOINTMENT FOR REFILLS>  . mirtazapine (REMERON) 15 MG tablet Take 15 mg by mouth at bedtime.  . Multiple Vitamins-Minerals (MULTIVITAMIN WITH MINERALS) tablet Take 1 tablet by mouth daily.  . niacin (NIASPAN) 500 MG CR tablet Take 1 tablet (500 mg total) by mouth at bedtime.  . [DISCONTINUED] niacin (NIASPAN) 500 MG CR tablet TAKE 1 TABLET (500 MG TOTAL) BY MOUTH AT BEDTIME.    Allergies  Allergen Reactions  . Codeine Other (See Comments)    Jitters    Social History   Social History  . Marital status: Divorced    Spouse name: N/A  . Number of children: N/A  . Years of education: N/A   Social History Main Topics  . Smoking status: Former Smoker    Quit date: 08/15/2010  . Smokeless tobacco: Never Used  . Alcohol use 0.6 oz/week    1 Shots of liquor per week     Comment: beers  . Drug use: No  . Sexual activity: Not Asked   Other  Topics Concern  . 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 His divorced father of 3 stop smoking in March 2012. He has occasional alcohol.   Family History  Problem Relation Age of Onset  . Diabetes Mother   . CAD Mother   . Cancer Father        unknown  . Pneumonia Father   . CAD Sister     Wt Readings from Last 3 Encounters:  10/31/16 161 lb 6.4 oz (73.2 kg)  10/09/15 163 lb (73.9 kg)  09/19/14 154 lb (69.9 kg)    PHYSICAL EXAM BP (!) 157/85   Pulse 80   Ht 5\' 7"  (1.702 m)   Wt 161 lb 6.4 oz (73.2 kg)  SpO2 100%   BMI 25.28 kg/m   Physical Exam  Constitutional: He is oriented to person, place, and time. He appears well-developed and well-nourished. No distress.  HENT:  Head: Normocephalic and atraumatic.  Neck: No hepatojugular reflux and no JVD present. Carotid bruit is not present.  Cardiovascular: Normal rate, regular rhythm, intact distal pulses and normal pulses.   No extrasystoles are present. PMI is not displaced.  Exam reveals no gallop and no friction rub.   No murmur heard. Pulmonary/Chest: Effort normal and breath sounds normal. No respiratory distress. He has no wheezes. He has no rales.  Abdominal: Soft. Bowel sounds are normal. He exhibits no distension. There is no tenderness. There is no rebound.  Musculoskeletal: Normal range of motion. He exhibits no edema.  Neurological: He is alert and oriented to person, place, and time.  Skin: Skin is warm and dry. No rash noted. No erythema.  Psychiatric: He has a normal mood and affect. His behavior is normal. Judgment and thought content normal.  Nursing note and vitals reviewed.   Adult ECG Report  Rate: 80;  Rhythm: normal sinus rhythm;  Poor R wave progression suggestive of septal MI, age undetermined. No change from previous   Other studies  Reviewed: Additional studies/ records that were reviewed today include:  Recent Labs:  None available   ASSESSMENT / PLAN: Problem List Items Addressed This Visit    CAD in native artery - s/p PTCA, then CABG - Primary (Chronic)    Doing well with no further anginal symptoms. Last stress test was 2 years ago. 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 Would probably not be due for another stress test until 2019 or 2020. If he is not having symptoms, I would probably wait till 2020. He is on aspirin, statin, beta blocker, ACE inhibitor and amlodipine. We can reduce his aspirin dose to 81 mg.      Relevant Medications   niacin (NIASPAN) 500 MG CR tablet   Other Relevant Orders   EKG 12-Lead   Dyslipidemia, goal LDL below 70 (Chronic)    On stable regimen. Labs are being followed by his PCP. Due to be checked soon. I don't have the results at this point.      Relevant Medications   niacin (NIASPAN) 500 MG CR tablet   Other Relevant Orders   EKG 12-Lead   Essential hypertension (Chronic)    Blood pressure was high in our clinic today, but is stable at home. He will follow this up with Dr. Shelia Media in probably the next few weeks or month. If his pressures continue be elevated, we can consider increasing amlodipine to 10 mg. Otherwise he is on stable regimen. I have been holding off on increasing beta blocker dose because of concern for possible fatigue. Would therefore preferentially increase amlodipine if more blood pressure control as needed.      Relevant Medications   niacin (NIASPAN) 500 MG CR tablet   Other Relevant Orders   EKG 12-Lead   Intermittent palpitations    Cindy relatively well-controlled now on current dose of beta blocker. Recommend further titrate because of concern for fatigue.      Relevant Orders   EKG 12-Lead   Ischemic cardiomyopathy mild to moderate (Chronic)    Myoview EF appears to be back to normal.  He is not having any significant heart failure symptoms of PND, orthopnea or edema. He is on stable  regimen but not requiring any diuretic. He is on beta blocker and ACE inhibitor with no significant PND, orthopnea or edema. Minimal exertional dyspnea if he overdoes it.  I suspect his EF is probably truly back to baseline. But there is no real need to recheck an echocardiogram unless symptoms worsen.       Relevant Medications   niacin (NIASPAN) 500 MG CR tablet   Other Relevant Orders   EKG 12-Lead       Current medicines are reviewed at length with the patient today. (+/- concerns)  The following changes have been made:  Patient Instructions  NO CHANGES MEDICATION   Your physician wants you to follow-up in [place].You will receive a reminder letter in the mail two months in advance. If you don't receive a letter, please call our office to schedule the follow-up appointment.   If you need a refill on your cardiac medications before your next appointment, please call your pharmacy.     Studies Ordered:   Orders Placed This Encounter  Procedures  . EKG 12-Lead     Glenetta Hew, M.D., M.S. Interventional Cardiologist   Pager # [phone]

## 2016-10-31 NOTE — Patient Instructions (Signed)
NO CHANGES MEDICATION   Your physician wants you to follow-up in [place].You will receive a reminder letter in the mail two months in advance. If you don't receive a letter, please call our office to schedule the follow-up appointment.   If you need a refill on your cardiac medications before your next appointment, please call your pharmacy.

## 2016-11-02 ENCOUNTER — Encounter: Payer: Self-pay | Admitting: Cardiology

## 2016-11-02 NOTE — Assessment & Plan Note (Signed)
Doing well with no further anginal symptoms. Last stress test was 2 years ago. 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 Would probably not be due for another stress test until 2019 or 2020. If he is not having symptoms, I would probably wait till 2020. He is on aspirin, statin, beta blocker, ACE inhibitor and amlodipine. We can reduce his aspirin dose to 81 mg.

## 2016-11-02 NOTE — Assessment & Plan Note (Signed)
Myoview EF appears to be back to normal. He is not having any significant heart failure symptoms of PND, orthopnea or edema. He is on stable regimen but not requiring any diuretic. He is on beta blocker and ACE inhibitor with no significant PND, orthopnea or edema. Minimal exertional dyspnea if he overdoes it.  I suspect his EF is probably truly back to baseline. But there is no real need to recheck an echocardiogram unless symptoms worsen.

## 2016-11-02 NOTE — Assessment & Plan Note (Signed)
Mark Mendoza relatively well-controlled now on current dose of beta blocker. Recommend further titrate because of concern for fatigue.

## 2016-11-02 NOTE — Assessment & Plan Note (Signed)
On stable regimen. Labs are being followed by his PCP. Due to be checked soon. I don't have the results at this point.

## 2016-11-02 NOTE — Assessment & Plan Note (Signed)
Blood pressure was high in our clinic today, but is stable at home. He will follow this up with Dr. Shelia Media in probably the next few weeks or month. If his pressures continue be elevated, we can consider increasing amlodipine to 10 mg. Otherwise he is on stable regimen. I have been holding off on increasing beta blocker dose because of concern for possible fatigue. Would therefore preferentially increase amlodipine if more blood pressure control as needed.

## 2016-11-10 ENCOUNTER — Other Ambulatory Visit: Payer: Self-pay | Admitting: Cardiology

## 2016-11-10 NOTE — Telephone Encounter (Signed)
REFILL 

## 2017-02-28 ENCOUNTER — Other Ambulatory Visit: Payer: Self-pay | Admitting: Cardiology

## 2017-11-19 ENCOUNTER — Other Ambulatory Visit: Payer: Self-pay | Admitting: Cardiology

## 2017-12-05 DIAGNOSIS — E7800 Pure hypercholesterolemia, unspecified: Secondary | ICD-10-CM | POA: Diagnosis not present

## 2017-12-05 DIAGNOSIS — E039 Hypothyroidism, unspecified: Secondary | ICD-10-CM | POA: Diagnosis not present

## 2017-12-05 DIAGNOSIS — I2510 Atherosclerotic heart disease of native coronary artery without angina pectoris: Secondary | ICD-10-CM | POA: Diagnosis not present

## 2017-12-05 DIAGNOSIS — I10 Essential (primary) hypertension: Secondary | ICD-10-CM | POA: Diagnosis not present

## 2017-12-05 DIAGNOSIS — K219 Gastro-esophageal reflux disease without esophagitis: Secondary | ICD-10-CM | POA: Diagnosis not present

## 2017-12-11 ENCOUNTER — Other Ambulatory Visit: Payer: Self-pay | Admitting: Cardiology

## 2017-12-12 DIAGNOSIS — I2510 Atherosclerotic heart disease of native coronary artery without angina pectoris: Secondary | ICD-10-CM | POA: Diagnosis not present

## 2017-12-12 DIAGNOSIS — K219 Gastro-esophageal reflux disease without esophagitis: Secondary | ICD-10-CM | POA: Diagnosis not present

## 2017-12-12 DIAGNOSIS — E039 Hypothyroidism, unspecified: Secondary | ICD-10-CM | POA: Diagnosis not present

## 2017-12-12 DIAGNOSIS — E7800 Pure hypercholesterolemia, unspecified: Secondary | ICD-10-CM | POA: Diagnosis not present

## 2017-12-12 DIAGNOSIS — I10 Essential (primary) hypertension: Secondary | ICD-10-CM | POA: Diagnosis not present

## 2017-12-12 DIAGNOSIS — Z125 Encounter for screening for malignant neoplasm of prostate: Secondary | ICD-10-CM | POA: Diagnosis not present

## 2018-01-11 ENCOUNTER — Other Ambulatory Visit: Payer: Self-pay | Admitting: Cardiology

## 2018-02-02 ENCOUNTER — Ambulatory Visit: Payer: PPO | Admitting: Cardiology

## 2018-02-02 ENCOUNTER — Encounter: Payer: Self-pay | Admitting: Cardiology

## 2018-02-02 VITALS — BP 152/84 | HR 72 | Ht 66.0 in | Wt 161.8 lb

## 2018-02-02 DIAGNOSIS — I255 Ischemic cardiomyopathy: Secondary | ICD-10-CM

## 2018-02-02 DIAGNOSIS — F17200 Nicotine dependence, unspecified, uncomplicated: Secondary | ICD-10-CM | POA: Diagnosis not present

## 2018-02-02 DIAGNOSIS — I2510 Atherosclerotic heart disease of native coronary artery without angina pectoris: Secondary | ICD-10-CM | POA: Diagnosis not present

## 2018-02-02 DIAGNOSIS — E785 Hyperlipidemia, unspecified: Secondary | ICD-10-CM | POA: Diagnosis not present

## 2018-02-02 DIAGNOSIS — R002 Palpitations: Secondary | ICD-10-CM

## 2018-02-02 DIAGNOSIS — I10 Essential (primary) hypertension: Secondary | ICD-10-CM | POA: Diagnosis not present

## 2018-02-02 NOTE — Patient Instructions (Signed)
Medication Instructions:   STOP TAKING NIACIN If you need a refill on your cardiac medications before your next appointment, please call your pharmacy.   Lab work: Not needed If you have labs (blood work) drawn today and your tests are completely normal, you will receive your results only by: [name] MyChart Message (if you have MyChart) OR . A paper copy in the mail If you have any lab test that is abnormal or we need to change your treatment, we will call you to review the results.  Testing/Procedures: Not needed  Follow-Up: At [facility], you and your health needs are our priority.  As part of our continuing mission to provide you with exceptional heart care, we have created designated Provider Care Teams.  These Care Teams include your primary Cardiologist (physician) and Advanced Practice Providers (APPs -  Physician Assistants and Nurse Practitioners) who all work together to provide you with the care you need, when you need it. You will need a follow up appointment in 12 months JAN 2021.  Please call our office 2 months in advance to schedule this appointment.  You may see Glenetta Hew, MD  or one of the following Advanced Practice Providers on your designated Care Team:   Rosaria Ferries, PA-C . Jory Sims, DNP, ANP  Any Other Special Instructions Will Be Listed Below (If Applicable).  STOP SMOKING -Your physician discussed the hazards of tobacco use. Tobacco use cessation is recommended and techniques and options to help you quit were discussed.

## 2018-02-02 NOTE — Progress Notes (Signed)
PCP: Deland Pretty, MD  Clinic Note: Chief Complaint  Patient presents with  . Follow-up    Doing well.  Only noted that he is back smoking cigarettes a little bit.  . Coronary Artery Disease    HPI: Mark Mendoza is a 68 y.o. male with a  with a h/o CAD-CABG noted below who returns for annual follow-up   CABG back in 2000,   Cardiac catheterization back in 2011 for what looks to be a false positive stress test.   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  Last Myoview 05/2013 following - Showed similar defect is noted in 2011. No ischemia with EF 63%.  He recently had a sleep study that was found to be okay.   C5 - pinched nerve - neuropathy/radiculopathy.  [name] was last seen in October 2018--> he was doing well at that time.   Recent Hospitalizations:   n/a  Studies Reviewed:   n/a  Interval History: [name] returns overall doing quite well.  He only notes some very rare palpitations but otherwise a stable from cardiac standpoint.  He denies any chest tightness pressure with rest or exertion.   He does all the home chores of cooking, cleaning.  He also enjoys chopping firewood.  He does plenty of walking but does not really get any routine exercise.  Cardiovascular ROS: no chest pain or dyspnea on exertion positive for - palpitations and Very rare negative for - edema, irregular heartbeat, loss of consciousness, orthopnea, paroxysmal nocturnal dyspnea, rapid heart rate, shortness of breath or near syncope, TIA/Amaurosis Fugax.  His only concern is that he started back smoking a little bit after having quit for several years.  He knows he needs to quit, but he is up to about a half a pack a day now.  He does not feel like cutting the 325 mg aspirin doses, but takes it may be every 3 days.  ROS: A comprehensive was performed. Review  of Systems  Constitutional: Negative for malaise/fatigue (still enjoying retirement &being home, but is now out and about getting plenty of exercise).  HENT: Negative for congestion and nosebleeds.   Respiratory: Negative for cough and shortness of breath.   Cardiovascular: Negative for claudication.  Gastrointestinal: Negative for blood in stool, heartburn and melena.  Genitourinary: Negative for dysuria, frequency and hematuria.  Musculoskeletal: Positive for joint pain (Knees and hips - doing well). Negative for myalgias.  Neurological: Negative for focal weakness.  Endo/Heme/Allergies: Negative for environmental allergies. Does not bruise/bleed easily.  Psychiatric/Behavioral: Negative.  Negative for memory loss. The patient is not nervous/anxious and does not have insomnia.   All other systems reviewed and are negative.   Past Medical History:  Diagnosis Date  . CAD (coronary artery disease), native coronary artery    Previous stent in LAD with ISR leading to CABG x 3 for prox disesae in native vessels.  Last cath 2011 for ? false + TM Myoview   . Dyslipidemia, goal LDL below 70   . GERD (gastroesophageal reflux disease)   . History of stress test 01/2009   Exercise myocardial perfusion imageing study with a small to moderate sized region of ischemia noted in the mid to apical anterior wall. There is scar noted in the apical region. The post stress left ventricle is normal in size.Abnormal Myocardial perfusion study.[name]  [name]  Hypertension   . Hypothyroidism 04/08/2011  . Ischemic cardiomyopathy     EF of roughly 40-45% by cath.no echogram done.   . Tonsillar cancer ([place]) dx'd 04/2010   Treated with radiation and chemotherapy. Competent for significant weight loss.    Past Surgical History:  Procedure Laterality Date  . CARDIAC CATHETERIZATION  2011   Done for abnormal Myoview: Patent grafts. Proximal left main disease subtotaled LAD after the stent with significant ISR. Small  circumflex vessel with 60% OM lesion prior to graft insertion. 100% proximal RCA occlusion.  . CORONARY ARTERY BYPASS GRAFT  2000   X3: LIMA-LAD, SVG-OM, SVG-RPDA. (Done for severe LAD in-stent restenosis, 60% OM1 and significant RCA disease.  [name] NM MYOVIEW LTD  2011; 07/2013   a) Question of small to moderate mid anterior defect with reversibility -- no lesion to explain. False positive;; b) EF 63%, same findings as noted on prior "false + study" -most consistent with prior infarct & mild ischemia.  med Rx    Current Meds  Medication Sig  . amLODipine (NORVASC) 5 MG tablet TAKE 1 TABLET BY MOUTH EVERY DAY  . aspirin 325 MG tablet Take 325 mg by mouth daily.  [name] atorvastatin (LIPITOR) 40 MG tablet TAKE 1 TABLET (40 MG TOTAL) BY MOUTH DAILY.  [name] esomeprazole (NEXIUM) 20 MG capsule Take 20 mg by mouth daily at 12 noon.  . fish oil-omega-3 fatty acids 1000 MG capsule Take 1 g by mouth daily.  [name] levothyroxine (SYNTHROID, LEVOTHROID) 50 MCG tablet TAKE 1 TABLET BY MOUTH EVERY DAY  . lisinopril (PRINIVIL,ZESTRIL) 40 MG tablet Take 40 mg by mouth Daily.  . metoprolol tartrate (LOPRESSOR) 25 MG tablet TAKE 1 TABLET (25 MG TOTAL) BY MOUTH 2 (TWO) TIMES DAILY. REFILL  . mirtazapine (REMERON) 15 MG tablet Take 15 mg by mouth at bedtime.  . Multiple Vitamins-Minerals (MULTIVITAMIN WITH MINERALS) tablet Take 1 tablet by mouth daily.  . [DISCONTINUED] niacin (NIASPAN) 500 MG CR tablet TAKE 1 TABLET (500 MG TOTAL) BY MOUTH AT BEDTIME.    Allergies  Allergen Reactions  . Codeine Other (See Comments)    Jitters    Social History   Tobacco Use  . Smoking status: Former Smoker    Last attempt to quit: 08/15/2010    Years since quitting: 7.4  . Smokeless tobacco: Never Used  Substance Use Topics  . Alcohol use: Yes    Alcohol/week: 1.0 standard drinks    Types: 1 Shots of liquor per week    Comment: beers  . 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   He had been very active prior to his cancer but has been less active now. His divorced father of 3 stop smoking in March 2012. He has occasional alcohol.    Family History  Problem Relation Age of Onset  . Diabetes Mother   . CAD Mother   . Cancer Father        unknown  . Pneumonia Father   . CAD Sister     Wt Readings from Last 3 Encounters:  02/02/18 161 lb 12.8 oz (73.4 kg)  10/31/16 161 lb 6.4 oz (73.2 kg)  10/09/15 163 lb (73.9 kg)    PHYSICAL EXAM BP (!) 152/84   Pulse 72   Ht 5\' 6"  (1.676 m)   Wt 161 lb 12.8 oz (  73.4 kg)   BMI 26.12 kg/m   Physical Exam  Constitutional: He is oriented to person, place, and time. He appears well-developed and well-nourished. No distress.  HENT:  Head: Normocephalic and atraumatic.  Neck: Normal range of motion. Neck supple. No hepatojugular reflux and no JVD present. Carotid bruit is not present.  Cardiovascular: Normal rate, regular rhythm, normal heart sounds, intact distal pulses and normal pulses.  No extrasystoles are present. PMI is not displaced. Exam reveals no gallop and no friction rub.  No murmur heard. Pulmonary/Chest: Effort normal and breath sounds normal. No respiratory distress. He has no wheezes. He has no rales.  Abdominal: Soft. Bowel sounds are normal. He exhibits no distension. There is no abdominal tenderness. There is no rebound.  Musculoskeletal: Normal range of motion.        General: No edema.  Neurological: He is alert and oriented to person, place, and time.  Psychiatric: He has a normal mood and affect. His behavior is normal. Judgment and thought content normal.  Nursing note and vitals reviewed.   Adult ECG Report  Rate: 72;  Rhythm: normal sinus rhythm;  Poor R wave progression suggestive of septal MI, age undetermined.  IVCD.  No change from previous   Other studies Reviewed: Additional studies/ records that were reviewed today  include:  Recent Labs: K PN from 12/12/2017: TC 135, TG 89, HDL 62, LDL 55.  Potassium 4.9.  Creatinine 0.87.  Hemoglobin 15.3.   ASSESSMENT / PLAN: Problem List Items Addressed This Visit    CAD in native artery - s/p PTCA, then CABG - Primary (Chronic)    Doing well with no further cardiac symptoms.  No further angina. 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 Remains on aspirin (not taking it correctly, but is taking 325 mg every other day or so.  Will convert to 81 mg when he runs out of current bottle. He is on stable dose of beta-blocker, amlodipine and lisinopril.  Is also on atorvastatin.  Should be due for lipid follow-up.  Otherwise stable.      Relevant Orders   EKG 12-Lead   Current smoker    Recently restarted smoking.  Needs to quit. He understands this.  We will gradually start to cut down.      Dyslipidemia, goal LDL below 70 (Chronic)    Most recent lab check showed LDL of 55 on current dose of atorvastatin.  Pretty much at goal.  No change.  Labs followed by PCP.  He actually stopped taking niacin a while ago.  I said is probably okay for him to stay off of it      Essential hypertension (Chronic)    Blood pressure is high today, but at home is usually in the 120-130/70s. He will follow his pressures at home, if they start to get higher, would probably need to increase either amlodipine or metoprolol. On max dose lisinopril.      Intermittent palpitations (Chronic)    Well-controlled on current dose of beta-blocker.  May need to consider titrating up further if blood pressure needs it.  But otherwise continue current dose.      Relevant Orders   EKG 12-Lead   Ischemic cardiomyopathy mild to moderate (Chronic)    Most recent echo in 2015 had a relatively normal EF. Suspect that revascularization helped his EF. No heart failure symptoms.  Euvolemic on exam.  On stable regimen with  ACE inhibitor and  beta-blocker along with amlodipine.      Relevant Orders   EKG 12-Lead      Current medicines are reviewed at length with the patient today. (+/- concerns)  The following changes have been made:  Patient Instructions  Medication Instructions:   STOP TAKING NIACIN If you need a refill on your cardiac medications before your next appointment, please call your pharmacy.   Lab work: Not needed If you have labs (blood work) drawn today and your tests are completely normal, you will receive your results only by: [name] MyChart Message (if you have MyChart) OR . A paper copy in the mail If you have any lab test that is abnormal or we need to change your treatment, we will call you to review the results.  Testing/Procedures: Not needed  Follow-Up: At [facility], you and your health needs are our priority.  As part of our continuing mission to provide you with exceptional heart care, we have created designated Provider Care Teams.  These Care Teams include your primary Cardiologist (physician) and Advanced Practice Providers (APPs -  Physician Assistants and Nurse Practitioners) who all work together to provide you with the care you need, when you need it. You will need a follow up appointment in 12 months JAN 2021.  Please call our office 2 months in advance to schedule this appointment.  You may see Glenetta Hew, MD  or one of the following Advanced Practice Providers on your designated Care Team:   Rosaria Ferries, PA-C . Jory Sims, DNP, ANP  Any Other Special Instructions Will Be Listed Below (If Applicable).  STOP SMOKING -Your physician discussed the hazards of tobacco use. Tobacco use cessation is recommended and techniques and options to help you quit were discussed.     Studies Ordered:   Orders Placed This Encounter  Procedures  . EKG 12-Lead     Glenetta Hew, M.D., M.S. Interventional Cardiologist   Pager # [phone]

## 2018-02-05 ENCOUNTER — Encounter: Payer: Self-pay | Admitting: Cardiology

## 2018-02-05 DIAGNOSIS — F17200 Nicotine dependence, unspecified, uncomplicated: Secondary | ICD-10-CM | POA: Insufficient documentation

## 2018-02-05 NOTE — Assessment & Plan Note (Signed)
Recently restarted smoking.  Needs to quit. He understands this.  We will gradually start to cut down.

## 2018-02-05 NOTE — Assessment & Plan Note (Signed)
Most recent echo in 2015 had a relatively normal EF. Suspect that revascularization helped his EF. No heart failure symptoms.  Euvolemic on exam.  On stable regimen with ACE inhibitor and beta-blocker along with amlodipine.

## 2018-02-05 NOTE — Assessment & Plan Note (Signed)
Well-controlled on current dose of beta-blocker.  May need to consider titrating up further if blood pressure needs it.  But otherwise continue current dose.

## 2018-02-05 NOTE — Assessment & Plan Note (Addendum)
Most recent lab check showed LDL of 55 on current dose of atorvastatin.  Pretty much at goal.  No change.  Labs followed by PCP.  He actually stopped taking niacin a while ago.  I said is probably okay for him to stay off of it

## 2018-02-05 NOTE — Assessment & Plan Note (Signed)
Doing well with no further cardiac symptoms.  No further angina. 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 Remains on aspirin (not taking it correctly, but is taking 325 mg every other day or so.  Will convert to 81 mg when he runs out of current bottle. He is on stable dose of beta-blocker, amlodipine and lisinopril.  Is also on atorvastatin.  Should be due for lipid follow-up.  Otherwise stable.

## 2018-02-05 NOTE — Assessment & Plan Note (Signed)
Blood pressure is high today, but at home is usually in the 120-130/70s. He will follow his pressures at home, if they start to get higher, would probably need to increase either amlodipine or metoprolol. On max dose lisinopril.

## 2018-02-11 ENCOUNTER — Other Ambulatory Visit: Payer: Self-pay | Admitting: Cardiology

## 2018-02-23 ENCOUNTER — Other Ambulatory Visit: Payer: Self-pay | Admitting: Cardiology

## 2018-02-23 NOTE — Telephone Encounter (Signed)
Rx(s) sent to pharmacy electronically.  

## 2018-03-17 ENCOUNTER — Other Ambulatory Visit: Payer: Self-pay | Admitting: Cardiology

## 2018-05-24 ENCOUNTER — Other Ambulatory Visit: Payer: Self-pay

## 2018-05-24 MED ORDER — ATORVASTATIN CALCIUM 40 MG PO TABS: ORAL_TABLET | ORAL | 1 refills | Status: DC

## 2018-06-05 DIAGNOSIS — E039 Hypothyroidism, unspecified: Secondary | ICD-10-CM | POA: Diagnosis not present

## 2018-06-05 DIAGNOSIS — Z125 Encounter for screening for malignant neoplasm of prostate: Secondary | ICD-10-CM | POA: Diagnosis not present

## 2018-06-05 DIAGNOSIS — Z1159 Encounter for screening for other viral diseases: Secondary | ICD-10-CM | POA: Diagnosis not present

## 2018-06-05 DIAGNOSIS — I10 Essential (primary) hypertension: Secondary | ICD-10-CM | POA: Diagnosis not present

## 2018-06-12 DIAGNOSIS — F17200 Nicotine dependence, unspecified, uncomplicated: Secondary | ICD-10-CM | POA: Diagnosis not present

## 2018-06-12 DIAGNOSIS — E039 Hypothyroidism, unspecified: Secondary | ICD-10-CM | POA: Diagnosis not present

## 2018-06-12 DIAGNOSIS — K219 Gastro-esophageal reflux disease without esophagitis: Secondary | ICD-10-CM | POA: Diagnosis not present

## 2018-06-12 DIAGNOSIS — E7800 Pure hypercholesterolemia, unspecified: Secondary | ICD-10-CM | POA: Diagnosis not present

## 2018-06-12 DIAGNOSIS — E875 Hyperkalemia: Secondary | ICD-10-CM | POA: Diagnosis not present

## 2018-06-12 DIAGNOSIS — Z23 Encounter for immunization: Secondary | ICD-10-CM | POA: Diagnosis not present

## 2018-06-12 DIAGNOSIS — I2510 Atherosclerotic heart disease of native coronary artery without angina pectoris: Secondary | ICD-10-CM | POA: Diagnosis not present

## 2018-06-12 DIAGNOSIS — Z85818 Personal history of malignant neoplasm of other sites of lip, oral cavity, and pharynx: Secondary | ICD-10-CM | POA: Diagnosis not present

## 2018-06-12 DIAGNOSIS — I10 Essential (primary) hypertension: Secondary | ICD-10-CM | POA: Diagnosis not present

## 2018-06-12 DIAGNOSIS — L570 Actinic keratosis: Secondary | ICD-10-CM | POA: Diagnosis not present

## 2018-08-24 ENCOUNTER — Other Ambulatory Visit: Payer: Self-pay

## 2018-09-05 ENCOUNTER — Other Ambulatory Visit: Payer: Self-pay | Admitting: Cardiology

## 2018-11-30 ENCOUNTER — Other Ambulatory Visit: Payer: Self-pay | Admitting: Cardiology

## 2018-11-30 NOTE — Telephone Encounter (Signed)
Rx(s) sent to pharmacy electronically.  

## 2019-03-20 ENCOUNTER — Other Ambulatory Visit: Payer: Self-pay | Admitting: Cardiology

## 2019-04-02 NOTE — Progress Notes (Signed)
Virtual Visit via Telephone Note   This visit type was conducted due to national recommendations for restrictions regarding the COVID-19 Pandemic (e.g. social distancing) in an effort to limit this patient's exposure and mitigate transmission in our community.  Due to his co-morbid illnesses, this patient is at least at moderate risk for complications without adequate follow up.  This format is felt to be most appropriate for this patient at this time.  The patient did not have access to video technology/had technical difficulties with video requiring transitioning to audio format only (telephone).  All issues noted in this document were discussed and addressed.  No physical exam could be performed with this format.  Please refer to the patient's chart for his  consent to telehealth for [facility].   Date:  04/03/2019   ID:  Mark Mendoza, DOB 1950-12-25, MRN [id number]  Patient Location: Home Provider Location: Office  PCP:  Deland Pretty, MD  Cardiologist:  Glenetta Hew, MD Electrophysiologist:  None   Evaluation Performed:  Follow-Up Visit  Chief Complaint:  Following   History of Present Illness:    Mark Mendoza is a 69 y.o. male we are following for ongoing assessment and management of CAD, history of CABG in 2000, LIMA to LAD, SVG to OM, SVG to RPDA), most recent cardiac catheterization in 2011 with patent grafts, dyslipidemia, hypertension, ischemic cardiomyopathy with an EF of 40% to 45%, with other history to include hypothyroidism, and reflux disease.  He was last seen in the office on 02/02/2018 by Dr. Ellyn Hack.  At that time the patient was doing well, and was converted from aspirin 325 mg daily to 81 mg daily.  He was continued on beta-blocker therapy, amlodipine, and lisinopril along with high-dose statin therapy.  He was advised on smoking cessation as he had recently restarted smoking again.  He was found to be slightly hypertensive in the office, but reported that his  blood pressures were normally lower at home.  If blood pressure remains elevated, medication titration will be made today.  Sees Dr.Pharr in May 2021 for labs. He unfortunately continues to smoke. He denies chest pain, DOE or fatigue. He remains active. He is medically compliant,   The patient have symptoms concerning for COVID-19 infection (fever, chills, cough, or new shortness of breath).    Past Medical History:  Diagnosis Date  . CAD (coronary artery disease), native coronary artery    Previous stent in LAD with ISR leading to CABG x 3 for prox disesae in native vessels.  Last cath 2011 for ? false + TM Myoview   . Dyslipidemia, goal LDL below 70   . GERD (gastroesophageal reflux disease)   . History of stress test 01/2009   Exercise myocardial perfusion imageing study with a small to moderate sized region of ischemia noted in the mid to apical anterior wall. There is scar noted in the apical region. The post stress left ventricle is normal in size.Abnormal Myocardial perfusion study..  . Hypertension   . Hypothyroidism 04/08/2011  . Ischemic cardiomyopathy     EF of roughly 40-45% by cath.no echogram done.   . Tonsillar cancer ([place]) dx'd 04/2010   Treated with radiation and chemotherapy. Competent for significant weight loss.   Past Surgical History:  Procedure Laterality Date  . CARDIAC CATHETERIZATION  2011   Done for abnormal Myoview: Patent grafts. Proximal left main disease subtotaled LAD after the stent with significant ISR. Small circumflex vessel with 60% OM lesion prior to graft  insertion. 100% proximal RCA occlusion.  . CORONARY ARTERY BYPASS GRAFT  2000   X3: LIMA-LAD, SVG-OM, SVG-RPDA. (Done for severe LAD in-stent restenosis, 60% OM1 and significant RCA disease.  [name] NM MYOVIEW LTD  2011; 07/2013   a) Question of small to moderate mid anterior defect with reversibility -- no lesion to explain. False positive;; b) EF 63%, same findings as noted on prior "false + study" -most  consistent with prior infarct & mild ischemia.  med Rx      Current Meds  Medication Sig  . amLODipine (NORVASC) 5 MG tablet Take 1 tablet (5 mg total) by mouth daily.  [name] aspirin EC 81 MG tablet Take 81 mg by mouth daily.  [name] atorvastatin (LIPITOR) 40 MG tablet TAKE 1 TABLET BY MOUTH EVERY DAY  . esomeprazole (NEXIUM) 20 MG capsule Take 20 mg by mouth daily at 12 noon.  . fish oil-omega-3 fatty acids 1000 MG capsule Take 1 g by mouth daily.  [name] levothyroxine (SYNTHROID, LEVOTHROID) 50 MCG tablet TAKE 1 TABLET BY MOUTH EVERY DAY  . lisinopril (PRINIVIL,ZESTRIL) 40 MG tablet Take 40 mg by mouth Daily.  . metoprolol tartrate (LOPRESSOR) 25 MG tablet TAKE 1 TABLET BY MOUTH TWICE A DAY  . mirtazapine (REMERON) 15 MG tablet Take 15 mg by mouth at bedtime.  . Multiple Vitamins-Minerals (MULTIVITAMIN WITH MINERALS) tablet Take 1 tablet by mouth daily.     Allergies:   Codeine   Social History   Tobacco Use  . Smoking status: Former Smoker    Quit date: 08/15/2010    Years since quitting: 8.6  . Smokeless tobacco: Never Used  Substance Use Topics  . Alcohol use: Yes    Alcohol/week: 1.0 standard drinks    Types: 1 Shots of liquor per week    Comment: beers  . Drug use: No     Family Hx: The patient's family history includes CAD in his mother and sister; Cancer in his father; Diabetes in his mother; Pneumonia in his father.  ROS:   Please see the history of present illness.     All other systems reviewed and are negative.   Prior CV studies:   The following studies were reviewed today: NM Stress Test 06/18/2013  Exercise Capacity:  Good exercise capacity. BP Response:  Normal blood pressure response. Clinical Symptoms:  No significant symptoms noted. ECG Impression:  No significant ST segment change suggestive of ischemia. Comparison with Prior Nuclear Study: No significant change from previous study  Overall Impression:  Low risk stress nuclear study Mild antero apical  ischemia. F/U with Dr. Ellyn Hack.  Labs/Other Tests and Data Reviewed:    EKG:  No ECG reviewed.  Recent Labs: No results found for requested labs within last 8760 hours.   Recent Lipid Panel No results found for: CHOL, TRIG, HDL, CHOLHDL, LDLCALC, LDLDIRECT  Wt Readings from Last 3 Encounters:  04/03/19 160 lb (72.6 kg)  02/02/18 161 lb 12.8 oz (73.4 kg)  10/31/16 161 lb 6.4 oz (73.2 kg)     Objective:    Vital Signs:  BP 140/76   Pulse 80   Ht 5\' 6"  (1.676 m)   Wt 160 lb (72.6 kg)   BMI 25.82 kg/m    Limited Assessment via telephone visit.  ASSESSMENT & PLAN:    1. CAD: Know history of CAD with CABG in 2000. Most recent cath in 2011 with patent graphs. He denies any new symptoms and remains active and medically compliant. No changes in his  medication regimen. He is due for labs from his PCP in May.   2. Hypertension:BP is stable per patient, with values ranging in the [id number] to 123456 systolic. Continue lisinopril and amlodipine. Labs in May per  PCP  3. Hyperlipidemia: Goal of LDL < 70 on atorvastatin. His last LDL in 2017 was 96.  Will await labs from PCP  4. Ongoing tobacco abuse: He continues to smoke. He knows he needs to quit but is not inclined to do so at this time.   COVID-19 Education: The signs and symptoms of COVID-19 were discussed with the patient and how to seek care for testing (follow up with PCP or arrange E-visit).  The importance of social distancing was discussed today. He is not ready to accept the COVID vaccine at this time.   Time:   Today, I have spent 15 minutes with the patient with telehealth technology discussing the above problems.     Medication Adjustments/Labs and Tests Ordered: Current medicines are reviewed at length with the patient today.  Concerns regarding medicines are outlined above.   Tests Ordered: No orders of the defined types were placed in this encounter.   Medication Changes: No orders of the defined types were placed  in this encounter.   Disposition:  Follow up 6 months.   Signed, Phill Myron. [place] Pugh, ANP, AACC  04/03/2019 9:01 AM    [facility]

## 2019-04-03 ENCOUNTER — Encounter: Payer: Self-pay | Admitting: Adult Health

## 2019-04-03 ENCOUNTER — Telehealth (INDEPENDENT_AMBULATORY_CARE_PROVIDER_SITE_OTHER): Payer: PPO | Admitting: Adult Health

## 2019-04-03 VITALS — BP 140/76 | HR 80 | Ht 66.0 in | Wt 160.0 lb

## 2019-04-03 DIAGNOSIS — I252 Old myocardial infarction: Secondary | ICD-10-CM | POA: Diagnosis not present

## 2019-04-03 DIAGNOSIS — Z720 Tobacco use: Secondary | ICD-10-CM

## 2019-04-03 DIAGNOSIS — I10 Essential (primary) hypertension: Secondary | ICD-10-CM | POA: Diagnosis not present

## 2019-04-03 DIAGNOSIS — I2510 Atherosclerotic heart disease of native coronary artery without angina pectoris: Secondary | ICD-10-CM

## 2019-04-03 NOTE — Patient Instructions (Signed)

## 2019-04-20 ENCOUNTER — Other Ambulatory Visit: Payer: Self-pay | Admitting: Cardiology

## 2019-05-10 ENCOUNTER — Other Ambulatory Visit: Payer: Self-pay | Admitting: Cardiology

## 2019-06-12 ENCOUNTER — Other Ambulatory Visit: Payer: Self-pay | Admitting: Cardiology

## 2019-06-12 DIAGNOSIS — I10 Essential (primary) hypertension: Secondary | ICD-10-CM | POA: Diagnosis not present

## 2019-06-12 DIAGNOSIS — N390 Urinary tract infection, site not specified: Secondary | ICD-10-CM | POA: Diagnosis not present

## 2019-06-12 DIAGNOSIS — E7800 Pure hypercholesterolemia, unspecified: Secondary | ICD-10-CM | POA: Diagnosis not present

## 2019-06-12 DIAGNOSIS — E039 Hypothyroidism, unspecified: Secondary | ICD-10-CM | POA: Diagnosis not present

## 2019-06-12 DIAGNOSIS — R749 Abnormal serum enzyme level, unspecified: Secondary | ICD-10-CM | POA: Diagnosis not present

## 2019-06-18 DIAGNOSIS — E7800 Pure hypercholesterolemia, unspecified: Secondary | ICD-10-CM | POA: Diagnosis not present

## 2019-06-18 DIAGNOSIS — Z23 Encounter for immunization: Secondary | ICD-10-CM | POA: Diagnosis not present

## 2019-06-18 DIAGNOSIS — K219 Gastro-esophageal reflux disease without esophagitis: Secondary | ICD-10-CM | POA: Diagnosis not present

## 2019-06-18 DIAGNOSIS — F17200 Nicotine dependence, unspecified, uncomplicated: Secondary | ICD-10-CM | POA: Diagnosis not present

## 2019-06-18 DIAGNOSIS — Z0000 Encounter for general adult medical examination without abnormal findings: Secondary | ICD-10-CM | POA: Diagnosis not present

## 2019-06-18 DIAGNOSIS — I10 Essential (primary) hypertension: Secondary | ICD-10-CM | POA: Diagnosis not present

## 2019-06-18 DIAGNOSIS — S50811A Abrasion of right forearm, initial encounter: Secondary | ICD-10-CM | POA: Diagnosis not present

## 2019-06-18 DIAGNOSIS — I2510 Atherosclerotic heart disease of native coronary artery without angina pectoris: Secondary | ICD-10-CM | POA: Diagnosis not present

## 2019-06-18 DIAGNOSIS — E039 Hypothyroidism, unspecified: Secondary | ICD-10-CM | POA: Diagnosis not present

## 2019-06-18 DIAGNOSIS — L570 Actinic keratosis: Secondary | ICD-10-CM | POA: Diagnosis not present

## 2019-06-27 DIAGNOSIS — K760 Fatty (change of) liver, not elsewhere classified: Secondary | ICD-10-CM | POA: Diagnosis not present

## 2019-06-27 DIAGNOSIS — R748 Abnormal levels of other serum enzymes: Secondary | ICD-10-CM | POA: Diagnosis not present

## 2019-07-04 DIAGNOSIS — Z1212 Encounter for screening for malignant neoplasm of rectum: Secondary | ICD-10-CM | POA: Diagnosis not present

## 2019-07-04 DIAGNOSIS — Z1211 Encounter for screening for malignant neoplasm of colon: Secondary | ICD-10-CM | POA: Diagnosis not present

## 2019-07-16 ENCOUNTER — Other Ambulatory Visit: Payer: Self-pay | Admitting: Cardiology

## 2019-08-20 ENCOUNTER — Other Ambulatory Visit: Payer: Self-pay | Admitting: Cardiovascular Disease

## 2019-09-13 ENCOUNTER — Other Ambulatory Visit: Payer: Self-pay

## 2019-09-16 ENCOUNTER — Other Ambulatory Visit: Payer: Self-pay

## 2019-10-17 ENCOUNTER — Ambulatory Visit: Payer: PPO | Admitting: Cardiology

## 2019-10-17 ENCOUNTER — Other Ambulatory Visit: Payer: Self-pay

## 2019-10-17 ENCOUNTER — Encounter: Payer: Self-pay | Admitting: Cardiology

## 2019-10-17 VITALS — BP 152/88 | HR 72 | Ht 66.0 in | Wt 166.8 lb

## 2019-10-17 DIAGNOSIS — E039 Hypothyroidism, unspecified: Secondary | ICD-10-CM

## 2019-10-17 DIAGNOSIS — E785 Hyperlipidemia, unspecified: Secondary | ICD-10-CM

## 2019-10-17 DIAGNOSIS — I255 Ischemic cardiomyopathy: Secondary | ICD-10-CM | POA: Diagnosis not present

## 2019-10-17 DIAGNOSIS — I10 Essential (primary) hypertension: Secondary | ICD-10-CM

## 2019-10-17 DIAGNOSIS — R002 Palpitations: Secondary | ICD-10-CM

## 2019-10-17 DIAGNOSIS — I25119 Atherosclerotic heart disease of native coronary artery with unspecified angina pectoris: Secondary | ICD-10-CM | POA: Diagnosis not present

## 2019-10-17 MED ORDER — AMLODIPINE BESYLATE 10 MG PO TABS: 10.0000 mg | ORAL_TABLET | Freq: Every day | ORAL | 3 refills | Status: DC

## 2019-10-17 MED ORDER — NITROGLYCERIN 0.4 MG SL SUBL: 0.4000 mg | SUBLINGUAL_TABLET | SUBLINGUAL | 12 refills | Status: DC | PRN

## 2019-10-17 NOTE — Patient Instructions (Signed)
Medication Instructions:  INCREASE AMLODIPINE TO 10 MG ONCE DAILY = TWO 5 MG TABLETS ONCE DAILY  *If you need a refill on your cardiac medications before your next appointment, please call your pharmacy*    Follow-Up: At [facility], you and your health needs are our priority.  As part of our continuing mission to provide you with exceptional heart care, we have created designated Provider Care Teams.  These Care Teams include your primary Cardiologist (physician) and Advanced Practice Providers (APPs -  Physician Assistants and Nurse Practitioners) who all work together to provide you with the care you need, when you need it.  We recommend signing up for the patient portal called "MyChart".  Sign up information is provided on this After Visit Summary.  MyChart is used to connect with patients for Virtual Visits (Telemedicine).  Patients are able to view lab/test results, encounter notes, upcoming appointments, etc.  Non-urgent messages can be sent to your provider as well.   To learn more about what you can do with MyChart, go to [email or web address].    Your next appointment:   12 month(s)  The format for your next appointment:   In Person  Provider:   You may see Glenetta Hew, MD or one of the following Advanced Practice Providers on your designated Care Team:    Rosaria Ferries, PA-C  Jory Sims, DNP, ANP

## 2019-10-17 NOTE — Progress Notes (Signed)
Primary Care Provider: Deland Pretty, MD Cardiologist: Glenetta Hew, MD Electrophysiologist: None  Clinic Note: Chief Complaint  Patient presents with  . Follow-up    28-month  . Coronary Artery Disease    No further angina    HPI:    Mark Mendoza is a 69 y.o. male with a PMH notable for CAD-CABG who presents today for 14-month follow-up.   CABG back in 2000,   Cardiac catheterization back in 2011 for what looks to be a false positive stress test.   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 ? Last Myoview 05/2013 following - Showed similar defect is noted in 2011. No ischemia with EF 63%.  Mark Mendoza was last seen in person January 11/13/2018 with rare palpitations but otherwise stable from cardiac standpoint. No chest tightness or pressure with rest or exertion. Does all his own chores including chopping firewood, cooking and cleaning.He was taking aspirin 325 mg every 3rd. -> noted hip and knee pain. He was enjoying being retired.  Changed to 81 mg aspirin daily. Advised smoking cessation counseling. Monitor blood pressure.  He was then seen via telemedicine on April 03, 2019 by Jory Sims, NP. Was doing well. Stable. No changes. Labs not yet available. Due to be done by PCP in May.   Recent Hospitalizations: None  Reviewed  CV studies:    The following studies were reviewed today: (if available, images/films reviewed: From Epic Chart or Care Everywhere) . None:   Interval History:   Mark Mendoza returns here today for delayed 38-month follow-up stating that overall he feels fairly well.  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  He says he is very active but has not really been doing routine exercise.  He is very active also  taking care of his grandson who he takes back for the school and this takes away a lot of his time that he used to go to have to do his exercise. He says his palpitations are pretty well controlled to the point where he may just feel a few skipped beats every now and then.  He says that his blood pressures have been a little high at home may be in the 140/80 range.  Not as high as they are today.  Denies any lightheadedness or dizziness.  No wooziness.  No angina or heart failure symptoms.  CV Review of Symptoms (Summary): no chest pain or dyspnea on exertion positive for - Rare short-lived palpitations negative for - edema, irregular heartbeat, orthopnea, paroxysmal nocturnal dyspnea, shortness of breath or Lightheadedness, dizziness or wooziness, syncope/near syncope, TIA/amaurosis fugax, claudication.  The patient does not have symptoms concerning for COVID-19 infection (fever, chills, cough, or new shortness of breath).   REVIEWED OF SYSTEMS   Review of Systems  Constitutional: Negative for malaise/fatigue and weight loss.  HENT: Negative for congestion.   Respiratory: Negative for cough, shortness of breath and wheezing.   Cardiovascular:       HPI  Gastrointestinal: Negative for abdominal pain, blood in stool and melena.  Genitourinary: Negative for hematuria.  Musculoskeletal: Positive for joint pain. Negative for falls and myalgias.  Neurological: Negative for focal weakness, weakness and headaches.  Endo/Heme/Allergies: Negative for environmental allergies. Does  not bruise/bleed easily.  Psychiatric/Behavioral: Negative for depression and memory loss. The patient does not have insomnia.     I have reviewed and (if needed) personally updated the patient's problem list, medications, allergies, past medical and surgical history, social and family history.   PAST MEDICAL HISTORY   Past Medical History:  Diagnosis Date  . CAD (coronary artery disease), native coronary artery     Previous stent in LAD with ISR leading to CABG x 3 for prox disesae in native vessels.  Last cath 2011 for ? false + TM Myoview   . Dyslipidemia, goal LDL below 70   . GERD (gastroesophageal reflux disease)   . History of stress test 01/2009   Exercise myocardial perfusion imageing study with a small to moderate sized region of ischemia noted in the mid to apical anterior wall. There is scar noted in the apical region. The post stress left ventricle is normal in size.Abnormal Myocardial perfusion study..  . Hypertension   . Hypothyroidism 04/08/2011  . Ischemic cardiomyopathy     EF of roughly 40-45% by cath.no echogram done.   . 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 Competent for significant weight loss.    PAST SURGICAL HISTORY   Past Surgical History:  Procedure Laterality Date  . CARDIAC CATHETERIZATION  2011   Done for abnormal Myoview: Patent grafts. Proximal left main disease subtotaled LAD after the stent with significant ISR. Small circumflex vessel with 60% OM lesion prior to graft insertion. 100% proximal RCA occlusion.  . CORONARY ARTERY BYPASS GRAFT  2000   X3: LIMA-LAD, SVG-OM, SVG-RPDA. (Done for severe LAD in-stent restenosis, 60% OM1 and significant RCA disease.  [name] NM MYOVIEW LTD  2011; 07/2013   a) Question of small to moderate mid anterior defect with reversibility -- no lesion to explain. False positive;; b) EF 63%, same findings as noted on prior "false + study" -most consistent with prior infarct & mild ischemia.  med Rx      There is no immunization history on file for this patient.  --> He has completed his 2 Pfizer COVID-19 vaccine injections.  MEDICATIONS/ALLERGIES   Current Meds  Medication Sig  . amLODipine (NORVASC) 10 MG tablet Take 1 tablet (10 mg total) by mouth daily.  [name] aspirin EC 81 MG tablet Take 81 mg by mouth daily.  [name] atorvastatin (LIPITOR) 40 MG tablet TAKE 1 TABLET BY MOUTH EVERY DAY  . esomeprazole  (NEXIUM) 20 MG capsule Take 20 mg by mouth daily at 12 noon.  . fish oil-omega-3 fatty acids 1000 MG capsule Take 1 g by mouth daily.  [name] levothyroxine (SYNTHROID, LEVOTHROID) 50 MCG tablet TAKE 1 TABLET BY MOUTH EVERY DAY  . lisinopril (PRINIVIL,ZESTRIL) 40 MG tablet Take 40 mg by mouth Daily.  . metoprolol tartrate (LOPRESSOR) 25 MG tablet Take 1 tablet (25 mg total) by mouth 2 (two) times daily.  . mirtazapine (REMERON) 15 MG tablet Take 15 mg by mouth at bedtime.  . Multiple Vitamins-Minerals (MULTIVITAMIN WITH MINERALS) tablet Take 1 tablet by mouth daily.  . [DISCONTINUED] amLODipine (NORVASC) 5 MG tablet TAKE 1 TABLET BY MOUTH EVERY DAY    Allergies  Allergen Reactions  . 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  Very active.  Helps to take care of his 2 grandkids.   OBJCTIVE -PE, EKG, labs  Wt Readings from Last 3 Encounters:  10/17/19 166 lb 12.8 oz (75.7 kg)  04/03/19 160 lb (72.6 kg)  02/02/18 161 lb 12.8 oz (73.4 kg)    Physical Exam: BP (!) 152/88   Pulse 72   Ht 5\' 6"  (1.676 m)   Wt 166 lb 12.8 oz (75.7 kg)   SpO2 98%   BMI 26.92 kg/m   Normal Physical Exam Vitals reviewed.  Constitutional:      General: He is not in acute distress.    Appearance: Normal appearance. He is normal weight. He is not ill-appearing or toxic-appearing.     Comments: Well-groomed.  HENT:     Head: Normocephalic and atraumatic.  Neck:     Vascular: No carotid bruit, hepatojugular reflux or JVD.  Cardiovascular:     Rate and Rhythm: Normal rate and regular rhythm.  No extrasystoles are present.    Chest Wall: PMI is not displaced.     Pulses: Intact distal pulses.     Heart sounds: Normal heart sounds. No murmur heard.  No friction rub. No gallop.   Pulmonary:     Effort: Pulmonary effort is normal. No respiratory distress.     Breath sounds: Normal  breath sounds.  Chest:     Chest wall: No tenderness.  Musculoskeletal:        General: No swelling. Normal range of motion.     Cervical back: Normal range of motion.  Skin:    General: Skin is warm and dry.  Neurological:     General: No focal deficit present.     Mental Status: He is alert and oriented to person, place, and time.  Psychiatric:        Mood and Affect: Mood normal.        Behavior: Behavior normal.        Thought Content: Thought content normal.        Judgment: Judgment normal.    Adult ECG Report  Rate: 72;  Rhythm: normal sinus rhythm and Septal infarct, age undetermined.  Otherwise normal axis, intervals durations.;   Narrative Interpretation: Stable EKG  Recent Labs: May 2021: TC 145, TG 133, HDL 57 LDL 65. Cr 1.09.  TSH 6.0. No results found for: CHOL, HDL, LDLCALC, LDLDIRECT, TRIG, CHOLHDL Lab Results  Component Value Date   CREATININE 1.0 06/20/2013   BUN 14.2 06/20/2013   NA 136 06/20/2013   K 4.1 06/20/2013   CL 100 04/11/2012   CO2 22 06/20/2013   Lab Results  Component Value Date   TSH 4.589 (H) 06/20/2013    ASSESSMENT/PLAN    Problem List Items Addressed This Visit    CAD in native artery - s/p PTCA, then CABG - Primary (Chronic)    Still doing well with no angina on current regimen.   Is due for routine surveillance stress test post CABG.  Plan:  He is on amlodipine which can increase to 10 mg.  Continue current dose of lisinopril and metoprolol tartrate.  Continuing aspirin for maintenance therapy along with atorvastatin at current dose.  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       Relevant Medications   amLODipine (NORVASC) 10 MG tablet   nitroGLYCERIN (NITROSTAT) 0.4 MG SL tablet   Other Relevant Orders   EKG 12-Lead (Completed)   Intermittent  palpitations (Chronic)  Well-controlled with metoprolol      Ischemic cardiomyopathy mild to moderate (Chronic)    He is yet to have a echocardiogram done following CABG.  Most recent Myoview showed a preserved EF.  We will check a new Myoview to exclude ischemia and assess EF.  Is on beta-blocker and ACE inhibitor.  Euvolemic, not requiring diuretic.      Relevant Medications   amLODipine (NORVASC) 10 MG tablet   nitroGLYCERIN (NITROSTAT) 0.4 MG SL tablet   Essential hypertension (Chronic)    Blood pressure continues to be high today.  It has been running high at home.  Plan: Increase amlodipine to 10 mg daily and continue lisinopril plus metoprolol tartrate. -> Next up would be to titrate up beta-blocker --> although we may consider converting to carvedilol for better blood pressure control with less rate control.      Relevant Medications   amLODipine (NORVASC) 10 MG tablet   nitroGLYCERIN (NITROSTAT) 0.4 MG SL tablet   Hypothyroid (Chronic)    Followed by PCP and endocrinology.      Dyslipidemia, goal LDL below 70 (Chronic)    Last LDL in May was well controlled with well-controlled triglycerides well.  Remains on stable dose of atorvastatin 40 mg daily with no myalgias.    No change.      Relevant Medications   amLODipine (NORVASC) 10 MG tablet   nitroGLYCERIN (NITROSTAT) 0.4 MG SL tablet       COVID-19 Education: The signs and symptoms of COVID-19 were discussed with the patient and how to seek care for testing (follow up with PCP or arrange E-visit).   The importance of social distancing and COVID-19 vaccination was discussed today.  The patient is practicing social distancing & Masking.   I spent a total of 90minutes with the patient spent in direct patient consultation.  Additional time spent with chart review  / charting (studies, outside notes, etc): 10 Total Time: 35  min   Current medicines are reviewed at length with the patient today.  (+/-  concerns) none  Notice: This dictation was prepared with Dragon dictation along with smaller phrase technology. Any transcriptional errors that result from this process are unintentional and may not be corrected upon review.  Patient Instructions / Medication Changes & Studies & Tests Ordered   Patient Instructions  Medication Instructions:  INCREASE AMLODIPINE TO 10 MG ONCE DAILY = TWO 5 MG TABLETS ONCE DAILY  *If you need a refill on your cardiac medications before your next appointment, please call your pharmacy*    Follow-Up: At [facility], you and your health needs are our priority.  As part of our continuing mission to provide you with exceptional heart care, we have created designated Provider Care Teams.  These Care Teams include your primary Cardiologist (physician) and Advanced Practice Providers (APPs -  Physician Assistants and Nurse Practitioners) who all work together to provide you with the care you need, when you need it.  We recommend signing up for the patient portal called "MyChart".  Sign up information is provided on this After Visit Summary.  MyChart is used to connect with patients for Virtual Visits (Telemedicine).  Patients are able to view lab/test results, encounter notes, upcoming appointments, etc.  Non-urgent messages can be sent to your provider as well.   To learn more about what you can do with MyChart, go to [email or web address].    Your next appointment:   12 month(s)  The format for your next appointment:   In  Person  Provider:   You may see Glenetta Hew, MD or one of the following Advanced Practice Providers on your designated Care Team:    Rosaria Ferries, PA-C  Jory Sims, DNP, ANP      Studies Ordered:   Orders Placed This Encounter  Procedures  . EKG 12-Lead     Glenetta Hew, M.D., M.S. Interventional Cardiologist   Pager # [phone] Phone # [phone] [address] [place], [place]  [address]   Thank you for choosing Heart[facility] at [facility]!!

## 2019-10-22 ENCOUNTER — Encounter: Payer: Self-pay | Admitting: Cardiology

## 2019-10-23 ENCOUNTER — Encounter: Payer: Self-pay | Admitting: Cardiology

## 2019-10-23 NOTE — Assessment & Plan Note (Signed)
Followed by PCP and endocrinology.

## 2019-10-23 NOTE — Assessment & Plan Note (Signed)
Blood pressure continues to be high today.  It has been running high at home.  Plan: Increase amlodipine to 10 mg daily and continue lisinopril plus metoprolol tartrate. -> Next up would be to titrate up beta-blocker --> although we may consider converting to carvedilol for better blood pressure control with less rate control.

## 2019-10-23 NOTE — Assessment & Plan Note (Signed)
He is yet to have a echocardiogram done following CABG.  Most recent Myoview showed a preserved EF.  We will check a new Myoview to exclude ischemia and assess EF.  Is on beta-blocker and ACE inhibitor.  Euvolemic, not requiring diuretic.

## 2019-10-23 NOTE — Assessment & Plan Note (Signed)
Still doing well with no angina on current regimen.   Is due for routine surveillance stress test post CABG.  Plan:  He is on amlodipine which can increase to 10 mg.  Continue current dose of lisinopril and metoprolol tartrate.  Continuing aspirin for maintenance therapy along with atorvastatin at current dose.  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

## 2019-10-23 NOTE — Assessment & Plan Note (Signed)
Well controlled with metoprolol.    

## 2019-10-23 NOTE — Assessment & Plan Note (Signed)
Last LDL in May was well controlled with well-controlled triglycerides well.  Remains on stable dose of atorvastatin 40 mg daily with no myalgias.    No change.

## 2020-05-25 ENCOUNTER — Other Ambulatory Visit: Payer: Self-pay | Admitting: Cardiovascular Disease

## 2020-08-27 ENCOUNTER — Other Ambulatory Visit: Payer: Self-pay | Admitting: Cardiology

## 2020-12-14 ENCOUNTER — Other Ambulatory Visit: Payer: Self-pay | Admitting: Cardiovascular Disease

## 2020-12-14 ENCOUNTER — Other Ambulatory Visit: Payer: Self-pay | Admitting: Cardiology

## 2021-06-01 DIAGNOSIS — E039 Hypothyroidism, unspecified: Secondary | ICD-10-CM | POA: Diagnosis not present

## 2021-06-01 DIAGNOSIS — E7800 Pure hypercholesterolemia, unspecified: Secondary | ICD-10-CM | POA: Diagnosis not present

## 2021-06-01 DIAGNOSIS — Z125 Encounter for screening for malignant neoplasm of prostate: Secondary | ICD-10-CM | POA: Diagnosis not present

## 2021-06-01 DIAGNOSIS — I10 Essential (primary) hypertension: Secondary | ICD-10-CM | POA: Diagnosis not present

## 2021-06-02 DIAGNOSIS — I10 Essential (primary) hypertension: Secondary | ICD-10-CM | POA: Diagnosis not present

## 2021-06-02 DIAGNOSIS — Z0000 Encounter for general adult medical examination without abnormal findings: Secondary | ICD-10-CM | POA: Diagnosis not present

## 2021-06-02 DIAGNOSIS — E039 Hypothyroidism, unspecified: Secondary | ICD-10-CM | POA: Diagnosis not present

## 2021-06-02 DIAGNOSIS — I2510 Atherosclerotic heart disease of native coronary artery without angina pectoris: Secondary | ICD-10-CM | POA: Diagnosis not present

## 2021-06-02 DIAGNOSIS — F17200 Nicotine dependence, unspecified, uncomplicated: Secondary | ICD-10-CM | POA: Diagnosis not present

## 2021-06-02 DIAGNOSIS — K219 Gastro-esophageal reflux disease without esophagitis: Secondary | ICD-10-CM | POA: Diagnosis not present

## 2021-06-02 DIAGNOSIS — D492 Neoplasm of unspecified behavior of bone, soft tissue, and skin: Secondary | ICD-10-CM | POA: Diagnosis not present

## 2021-06-02 DIAGNOSIS — Z85818 Personal history of malignant neoplasm of other sites of lip, oral cavity, and pharynx: Secondary | ICD-10-CM | POA: Diagnosis not present

## 2021-06-18 ENCOUNTER — Other Ambulatory Visit: Payer: Self-pay | Admitting: Cardiology

## 2021-10-10 ENCOUNTER — Other Ambulatory Visit: Payer: Self-pay | Admitting: Cardiology

## 2021-11-06 ENCOUNTER — Other Ambulatory Visit: Payer: Self-pay | Admitting: Cardiology

## 2021-11-13 ENCOUNTER — Other Ambulatory Visit: Payer: Self-pay | Admitting: Cardiovascular Disease

## 2021-11-23 DIAGNOSIS — L570 Actinic keratosis: Secondary | ICD-10-CM | POA: Diagnosis not present

## 2021-11-23 DIAGNOSIS — C44311 Basal cell carcinoma of skin of nose: Secondary | ICD-10-CM | POA: Diagnosis not present

## 2021-11-23 DIAGNOSIS — C4441 Basal cell carcinoma of skin of scalp and neck: Secondary | ICD-10-CM | POA: Diagnosis not present

## 2021-11-23 DIAGNOSIS — L814 Other melanin hyperpigmentation: Secondary | ICD-10-CM | POA: Diagnosis not present

## 2021-11-23 DIAGNOSIS — L578 Other skin changes due to chronic exposure to nonionizing radiation: Secondary | ICD-10-CM | POA: Diagnosis not present

## 2021-11-23 DIAGNOSIS — L821 Other seborrheic keratosis: Secondary | ICD-10-CM | POA: Diagnosis not present

## 2021-11-28 ENCOUNTER — Other Ambulatory Visit: Payer: Self-pay | Admitting: Cardiology

## 2021-12-01 ENCOUNTER — Other Ambulatory Visit: Payer: Self-pay | Admitting: Cardiology

## 2021-12-02 MED ORDER — ATORVASTATIN CALCIUM 40 MG PO TABS: ORAL_TABLET | ORAL | 0 refills | Status: DC

## 2021-12-02 NOTE — Telephone Encounter (Signed)
Patient came to the office  to arrange an appointment  for medication refills.  Patient will be schedule with the next available appointment with Dr Ellyn Hack 's NP/PA. Medication refilled  with no refills

## 2021-12-26 ENCOUNTER — Other Ambulatory Visit: Payer: Self-pay | Admitting: Cardiology

## 2021-12-30 DIAGNOSIS — C4441 Basal cell carcinoma of skin of scalp and neck: Secondary | ICD-10-CM | POA: Diagnosis not present

## 2021-12-30 DIAGNOSIS — C44311 Basal cell carcinoma of skin of nose: Secondary | ICD-10-CM | POA: Diagnosis not present

## 2022-01-11 NOTE — Progress Notes (Signed)
Error

## 2022-01-12 NOTE — Progress Notes (Signed)
Cardiology Clinic Note   Patient Name: Mark Mendoza Date of Encounter: 01/14/2022  Primary Care Provider:  Deland Pretty, MD Primary Cardiologist:  Mark Hew, MD  Patient Profile    Mark Mendoza is a 71 y.o. male with a past medical history of CAD s/p PTCA then CABG, hypertension, hyperlipidemia who presents to the clinic today for follow-up of chronic cardiac conditions.   Past Medical History    Past Medical History:  Diagnosis Date   CAD (coronary artery disease), native coronary artery    Previous stent in LAD with ISR leading to CABG x 3 for prox disesae in native vessels.  Last cath 2011 for ? false + TM Myoview    Dyslipidemia, goal LDL below 70    GERD (gastroesophageal reflux disease)    History of stress test 01/2009   Exercise myocardial perfusion imageing study with a small to moderate sized region of ischemia noted in the mid to apical anterior wall. There is scar noted in the apical region. The post stress left ventricle is normal in size.Abnormal Myocardial perfusion study..   Hypertension    Hypothyroidism 04/08/2011   Ischemic cardiomyopathy     EF of roughly 40-45% by cath.no echogram done.    Tonsillar cancer (Bent) dx'd 04/2010   Treated with radiation and chemotherapy. Competent for significant weight loss.   Past Surgical History:  Procedure Laterality Date   CARDIAC CATHETERIZATION  2011   Done for abnormal Myoview: Patent grafts. Proximal left main disease subtotaled LAD after the stent with significant ISR. Small circumflex vessel with 60% OM lesion prior to graft insertion. 100% proximal RCA occlusion.   CORONARY ARTERY BYPASS GRAFT  2000   X3: LIMA-LAD, SVG-OM, SVG-RPDA. (Done for severe LAD in-stent restenosis, 60% OM1 and significant RCA disease.   NM MYOVIEW LTD  2011; 07/2013   a) Question of small to moderate mid anterior defect with reversibility -- no lesion to explain. False positive;; b) EF 63%, same findings as noted on prior "false +  study" -most consistent with prior infarct & mild ischemia.  med Rx     Allergies  Allergies  Allergen Reactions   Codeine Other (See Comments)    Jitters    History of Present Illness    Mark Mendoza has a past medical history of: CAD. LHC acute MI June 2000: Stents to LAD, mid RCA, balloon angioplasty to diagonal.  LHC September 2000: Rotational atherectomy in-stent restenosis LAD.  LHC December 2000: 50-60% diffuse LM new from September. 75% in-stent restenosis mid LAD with 60-75% more distal LAD, 90% ostial first diagonal, 60% OM, 90% ostium of continuation branch of RCA and 60% proximal PDA.  CABG x 3 01/04/1999: LIMA to LAD, SVG to OM, SVT RPDA. LHC 02/20/2009: Patent grafts. (False positive stress test)  Stress test 06/18/2013: Low risk study. Mild anteroapical ischemia.  Hypertension. Hyperlipidemia.   Mark Mendoza is a long time cardiology patient initially followed by Mark Mendoza and subsequently followed by Mark Mendoza for CAD. He was last seen in the office by Mark Mendoza on 10/17/2019. He reported BP readings in the 140/80s at home. Amlodipine was increased to 10 mg daily. 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 He had not had one since 2015 so one was order. However, it does not appear it was ever completed.   Today, patient is doing well. Patient denies shortness of breath or dyspnea on exertion. No chest pain, pressure, or tightness. Denies lower  extremity edema, orthopnea, or PND. No palpitations. He stays active by cooking, cleaning, and doing yard work. He checks his BP regularly at home and it typically runs 110-130/60-70. Discussed the stress test that was mentioned at his last office visit with Mark Mendoza in September 2021. Patient reports is not interested in undergoing stress testing at this time.    Home Medications    Current Meds  Medication Sig   amLODipine (NORVASC) 10 MG tablet Take 1 tablet (10 mg total) by mouth daily.  Keep appt  to continue with refill 12/02/21   aspirin EC 81 MG tablet Take 81 mg by mouth daily.   atorvastatin (LIPITOR) 40 MG tablet TAKE 1 TABLET BY MOUTH EVERY DAY. Keep upcoming appt for further refills 12/02/21   esomeprazole (NEXIUM) 20 MG capsule Take 20 mg by mouth daily at 12 noon.   fish oil-omega-3 fatty acids 1000 MG capsule Take 1 g by mouth daily.   levothyroxine (SYNTHROID, LEVOTHROID) 50 MCG tablet TAKE 1 TABLET BY MOUTH EVERY DAY   lisinopril (PRINIVIL,ZESTRIL) 40 MG tablet Take 40 mg by mouth Daily.   metoprolol tartrate (LOPRESSOR) 25 MG tablet TAKE 1 TABLET BY MOUTH TWICE A DAY   mirtazapine (REMERON) 15 MG tablet Take 15 mg by mouth at bedtime.   Multiple Vitamins-Minerals (MULTIVITAMIN WITH MINERALS) tablet Take 1 tablet by mouth daily.    Family History    Family History  Problem Relation Age of Onset   Diabetes Mother    CAD Mother    Cancer Father        unknown   Pneumonia Father    CAD Sister    He indicated that his mother is deceased. He indicated that his father is deceased. He indicated that only one of his two sisters is alive.   Social History    Social History   Socioeconomic History   Marital status: Divorced    Spouse name: Not on file   Number of children: Not on file   Years of education: Not on file   Highest education level: Not on file  Occupational History   Not on file  Tobacco Use   Smoking status: Every Day    Types: Cigarettes    Last attempt to quit: 08/15/2010    Years since quitting: 11.4   Smokeless tobacco: Never   Tobacco comments:    01/14/2022 Patient smokes 3/4 of a pack daily  Substance and Sexual Activity   Alcohol use: Yes    Alcohol/week: 1.0 standard drink of alcohol    Types: 1 Shots of liquor per week    Comment: beers   Drug use: No   Sexual activity: Not on file  Other Topics Concern   Not on file  Social History Narrative   Divorced father of 3      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  Very active.  Helps to take care of his 2 grandkids.      He stopped smoking in March 2012.    He has occasional alcohol.   Social Determinants of Health   Financial Resource Strain: Not on file  Food Insecurity: Not on file  Transportation Needs: Not on file  Physical Activity: Not on file  Stress: Not on file  Social Connections: Not on file  Intimate Partner Violence: Not on file  Review of Systems    General:  No chills, fever, night sweats or weight changes.  Cardiovascular:  No chest pain, dyspnea on exertion, edema, orthopnea, palpitations, paroxysmal nocturnal dyspnea. Dermatological: No rash, lesions/masses Respiratory: No cough, dyspnea Urologic: No hematuria, dysuria Abdominal:   No nausea, vomiting, diarrhea, bright red blood per rectum, melena, or hematemesis Neurologic:  No visual changes, weakness, changes in mental status. All other systems reviewed and are otherwise negative except as noted above.  Physical Exam    VS:  BP 126/70 (BP Location: Right Arm, Patient Position: Sitting, Cuff Size: Normal)   Pulse 74   Ht '5\' 6"'$  (1.676 m)   Wt 157 lb 3.2 oz (71.3 kg)   SpO2 96%   BMI 25.37 kg/m  , BMI Body mass index is 25.37 kg/m. GEN:  Well nourished, well developed, in no acute distress. HEENT: Normal. Neck: Supple, no JVD, carotid bruits, or masses. Cardiac: RRR, no murmurs, rubs, or gallops. No clubbing, cyanosis, edema.  Radials/DP/PT 2+ and equal bilaterally.  Respiratory:  Respirations regular and unlabored, clear to auscultation bilaterally. GI: Soft, nontender, nondistended. MS: No deformity or atrophy. Skin: Warm and dry, no rash. Neuro: Strength and sensation are intact. Psych: Normal affect.  Accessory Clinical Findings    The Recent Labs: No results found for requested labs within last 365 days.   Recent Lipid Panel No results found for:  "CHOL", "TRIG", "HDL", "CHOLHDL", "VLDL", "LDLCALC", "LDLDIRECT"      ECG personally reviewed by me today: NSR, HR 74.  No significant changes from 10/17/2019      Assessment & Plan   CAD. LHC in 2011 showed patent grafts. Stress test 2015 was a low risk study. Patient denies chest pain, pressure or tightness. He is active with cooking, cleaning and yard work without shortness of breath or chest pain. He is not interested in undergoing stress testing.  Continue aspirin, metoprolol, atorvastatin, and prn SL NTG.  Hypertension. BP today 144/82 at intake and 126/70 at recheck. Patient denies headaches or dizziness. BP at home 110-130/60-70. Continue amlodipine, lisinopril, and metoprolol.  Hyperlipidemia. LDL 06/12/2019 65. He reports he had labs drawn this May with his PCP. Will request. Continue Lipitor 40 mg.      Disposition: Request labs from PCP. Return in 1 year or sooner as needed.    Justice Britain. Murl Zogg, NP-C     01/14/2022, 2:05 PM [address] Office [phone] Fax [phone]   I spent 8 minutes examining this patient, reviewing medications, and using patient centered shared decision making involving her cardiac care.  Prior to her visit I spent greater than 20 minutes reviewing her past medical history,  medications, and prior cardiac tests.

## 2022-01-14 ENCOUNTER — Encounter: Payer: Self-pay | Admitting: Adult Health

## 2022-01-14 ENCOUNTER — Ambulatory Visit: Payer: PPO | Attending: Cardiology | Admitting: Student

## 2022-01-14 VITALS — BP 126/70 | HR 74 | Ht 66.0 in | Wt 157.2 lb

## 2022-01-14 DIAGNOSIS — E785 Hyperlipidemia, unspecified: Secondary | ICD-10-CM | POA: Diagnosis not present

## 2022-01-14 DIAGNOSIS — I2510 Atherosclerotic heart disease of native coronary artery without angina pectoris: Secondary | ICD-10-CM | POA: Diagnosis not present

## 2022-01-14 DIAGNOSIS — I10 Essential (primary) hypertension: Secondary | ICD-10-CM

## 2022-01-14 MED ORDER — NITROGLYCERIN 0.4 MG SL SUBL: 0.4000 mg | SUBLINGUAL_TABLET | SUBLINGUAL | 3 refills | Status: DC | PRN

## 2022-01-14 NOTE — Patient Instructions (Signed)
Medication Instructions:  No changes *If you need a refill on your cardiac medications before your next appointment, please call your pharmacy*   Lab Work: No Labs If you have labs (blood work) drawn today and your tests are completely normal, you will receive your results only by: [place] (if you have MyChart) OR A paper copy in the mail If you have any lab test that is abnormal or we need to change your treatment, we will call you to review the results.   Testing/Procedures: No Testing   Follow-Up: At [facility], you and your health needs are our priority.  As part of our continuing mission to provide you with exceptional heart care, we have created designated Provider Care Teams.  These Care Teams include your primary Cardiologist (physician) and Advanced Practice Providers (APPs -  Physician Assistants and Nurse Practitioners) who all work together to provide you with the care you need, when you need it.  We recommend signing up for the patient portal called "MyChart".  Sign up information is provided on this After Visit Summary.  MyChart is used to connect with patients for Virtual Visits (Telemedicine).  Patients are able to view lab/test results, encounter notes, upcoming appointments, etc.  Non-urgent messages can be sent to your provider as well.   To learn more about what you can do with MyChart, go to [email or web address].    Your next appointment:   1 year(s)  The format for your next appointment:   In Person  Provider:   Glenetta Hew, MD

## 2022-01-25 DIAGNOSIS — C44321 Squamous cell carcinoma of skin of nose: Secondary | ICD-10-CM | POA: Diagnosis not present

## 2022-02-08 DIAGNOSIS — C4441 Basal cell carcinoma of skin of scalp and neck: Secondary | ICD-10-CM | POA: Diagnosis not present

## 2022-02-25 DIAGNOSIS — S1180XA Unspecified open wound of other specified part of neck, initial encounter: Secondary | ICD-10-CM | POA: Diagnosis not present

## 2022-03-03 ENCOUNTER — Other Ambulatory Visit: Payer: Self-pay | Admitting: Cardiology

## 2022-03-04 ENCOUNTER — Other Ambulatory Visit: Payer: Self-pay | Admitting: Cardiology

## 2022-04-01 DIAGNOSIS — L820 Inflamed seborrheic keratosis: Secondary | ICD-10-CM | POA: Diagnosis not present

## 2022-04-01 DIAGNOSIS — S1180XA Unspecified open wound of other specified part of neck, initial encounter: Secondary | ICD-10-CM | POA: Diagnosis not present

## 2022-05-05 DIAGNOSIS — S1180XA Unspecified open wound of other specified part of neck, initial encounter: Secondary | ICD-10-CM | POA: Diagnosis not present

## 2022-05-09 ENCOUNTER — Ambulatory Visit: Payer: PPO | Admitting: Cardiology

## 2022-05-29 ENCOUNTER — Other Ambulatory Visit: Payer: Self-pay | Admitting: Cardiology

## 2022-05-30 ENCOUNTER — Other Ambulatory Visit: Payer: Self-pay | Admitting: Cardiology

## 2022-06-02 DIAGNOSIS — E7800 Pure hypercholesterolemia, unspecified: Secondary | ICD-10-CM | POA: Diagnosis not present

## 2022-06-02 DIAGNOSIS — E039 Hypothyroidism, unspecified: Secondary | ICD-10-CM | POA: Diagnosis not present

## 2022-06-03 LAB — LAB REPORT - SCANNED: EGFR: 65

## 2022-06-08 DIAGNOSIS — L299 Pruritus, unspecified: Secondary | ICD-10-CM | POA: Diagnosis not present

## 2022-06-08 DIAGNOSIS — E871 Hypo-osmolality and hyponatremia: Secondary | ICD-10-CM | POA: Diagnosis not present

## 2022-06-08 DIAGNOSIS — E875 Hyperkalemia: Secondary | ICD-10-CM | POA: Diagnosis not present

## 2022-06-08 DIAGNOSIS — Z0000 Encounter for general adult medical examination without abnormal findings: Secondary | ICD-10-CM | POA: Diagnosis not present

## 2022-06-08 DIAGNOSIS — I10 Essential (primary) hypertension: Secondary | ICD-10-CM | POA: Diagnosis not present

## 2022-06-08 DIAGNOSIS — E039 Hypothyroidism, unspecified: Secondary | ICD-10-CM | POA: Diagnosis not present

## 2022-06-08 DIAGNOSIS — K219 Gastro-esophageal reflux disease without esophagitis: Secondary | ICD-10-CM | POA: Diagnosis not present

## 2022-06-08 DIAGNOSIS — Z23 Encounter for immunization: Secondary | ICD-10-CM | POA: Diagnosis not present

## 2022-06-08 DIAGNOSIS — I2510 Atherosclerotic heart disease of native coronary artery without angina pectoris: Secondary | ICD-10-CM | POA: Diagnosis not present

## 2022-06-08 DIAGNOSIS — R748 Abnormal levels of other serum enzymes: Secondary | ICD-10-CM | POA: Diagnosis not present

## 2022-06-15 DIAGNOSIS — R748 Abnormal levels of other serum enzymes: Secondary | ICD-10-CM | POA: Diagnosis not present

## 2022-06-15 DIAGNOSIS — K760 Fatty (change of) liver, not elsewhere classified: Secondary | ICD-10-CM | POA: Diagnosis not present

## 2022-06-15 DIAGNOSIS — E871 Hypo-osmolality and hyponatremia: Secondary | ICD-10-CM | POA: Diagnosis not present

## 2022-06-24 ENCOUNTER — Other Ambulatory Visit: Payer: Self-pay | Admitting: Cardiology

## 2022-07-15 DIAGNOSIS — Z1212 Encounter for screening for malignant neoplasm of rectum: Secondary | ICD-10-CM | POA: Diagnosis not present

## 2022-07-15 DIAGNOSIS — Z1211 Encounter for screening for malignant neoplasm of colon: Secondary | ICD-10-CM | POA: Diagnosis not present

## 2022-07-20 LAB — EXTERNAL GENERIC LAB PROCEDURE: COLOGUARD: POSITIVE — AB

## 2022-08-13 NOTE — Progress Notes (Signed)
Labs from 06/02/2022 Na+ 131, K+ 5.6 (H), Cl- 92, HCO3-22, BUN 11, Cr 1.21, Glu 94, Ca2+ 9.9; AST 18, ALT 31, AlkP 155 (H) CBC: W 7.6, H/H 13.8/38.6, Plt 340 TC 142, TG 127, HDL 54, LDL 66 *stable.  Looks good.;  TSH 4.05  Bryan Lemma, MD

## 2022-09-01 DIAGNOSIS — R195 Other fecal abnormalities: Secondary | ICD-10-CM | POA: Diagnosis not present

## 2022-09-21 DIAGNOSIS — Z1211 Encounter for screening for malignant neoplasm of colon: Secondary | ICD-10-CM | POA: Diagnosis not present

## 2022-09-21 DIAGNOSIS — K648 Other hemorrhoids: Secondary | ICD-10-CM | POA: Diagnosis not present

## 2022-09-21 DIAGNOSIS — D125 Benign neoplasm of sigmoid colon: Secondary | ICD-10-CM | POA: Diagnosis not present

## 2022-09-21 DIAGNOSIS — D124 Benign neoplasm of descending colon: Secondary | ICD-10-CM | POA: Diagnosis not present

## 2022-09-21 DIAGNOSIS — R195 Other fecal abnormalities: Secondary | ICD-10-CM | POA: Diagnosis not present

## 2022-09-23 DIAGNOSIS — D124 Benign neoplasm of descending colon: Secondary | ICD-10-CM | POA: Diagnosis not present

## 2022-09-23 DIAGNOSIS — D125 Benign neoplasm of sigmoid colon: Secondary | ICD-10-CM | POA: Diagnosis not present

## 2023-02-06 ENCOUNTER — Other Ambulatory Visit: Payer: Self-pay | Admitting: Cardiology

## 2023-03-03 ENCOUNTER — Other Ambulatory Visit: Payer: Self-pay | Admitting: Cardiology

## 2023-03-28 ENCOUNTER — Other Ambulatory Visit: Payer: Self-pay | Admitting: Cardiology

## 2023-04-03 ENCOUNTER — Telehealth: Payer: Self-pay | Admitting: Cardiology

## 2023-04-03 MED ORDER — METOPROLOL TARTRATE 25 MG PO TABS: 25.0000 mg | ORAL_TABLET | Freq: Two times a day (BID) | ORAL | 0 refills | Status: DC

## 2023-04-03 MED ORDER — AMLODIPINE BESYLATE 10 MG PO TABS: 10.0000 mg | ORAL_TABLET | Freq: Every day | ORAL | 0 refills | Status: DC

## 2023-04-03 MED ORDER — ATORVASTATIN CALCIUM 40 MG PO TABS: ORAL_TABLET | ORAL | 0 refills | Status: DC

## 2023-04-03 NOTE — Telephone Encounter (Signed)
 Pt's medications were sent to pt's pharmacy as requested. Confirmation received.

## 2023-04-03 NOTE — Telephone Encounter (Signed)
 This pt stopped in to make an f/u appt. While here he asked for refills on prescriptions from [facility] asking if they can be 90 day supplies.  I asked which ones, and he just said the ones prescribed by Dr Herbie Baltimore. He did verify pharmacy.

## 2023-06-08 DIAGNOSIS — R748 Abnormal levels of other serum enzymes: Secondary | ICD-10-CM | POA: Diagnosis not present

## 2023-06-08 DIAGNOSIS — E7800 Pure hypercholesterolemia, unspecified: Secondary | ICD-10-CM | POA: Diagnosis not present

## 2023-06-08 DIAGNOSIS — E039 Hypothyroidism, unspecified: Secondary | ICD-10-CM | POA: Diagnosis not present

## 2023-06-08 DIAGNOSIS — I10 Essential (primary) hypertension: Secondary | ICD-10-CM | POA: Diagnosis not present

## 2023-06-09 ENCOUNTER — Ambulatory Visit: Attending: Cardiology | Admitting: Cardiology

## 2023-06-09 ENCOUNTER — Encounter: Payer: Self-pay | Admitting: Cardiology

## 2023-06-09 VITALS — BP 124/72 | HR 69 | Ht 66.0 in | Wt 148.4 lb

## 2023-06-09 DIAGNOSIS — I10 Essential (primary) hypertension: Secondary | ICD-10-CM

## 2023-06-09 DIAGNOSIS — I2510 Atherosclerotic heart disease of native coronary artery without angina pectoris: Secondary | ICD-10-CM

## 2023-06-09 DIAGNOSIS — F17200 Nicotine dependence, unspecified, uncomplicated: Secondary | ICD-10-CM | POA: Diagnosis not present

## 2023-06-09 DIAGNOSIS — I25119 Atherosclerotic heart disease of native coronary artery with unspecified angina pectoris: Secondary | ICD-10-CM

## 2023-06-09 DIAGNOSIS — R002 Palpitations: Secondary | ICD-10-CM | POA: Diagnosis not present

## 2023-06-09 DIAGNOSIS — E785 Hyperlipidemia, unspecified: Secondary | ICD-10-CM

## 2023-06-09 NOTE — Progress Notes (Signed)
 Cardiology Office Note:  .   Date:  06/10/2023  ID:  Mark Mendoza, DOB May 08, 1950, MRN [id number] PCP: Imelda Man, MD  [facility] Providers Cardiologist:  Randene Bustard, MD     Chief Complaint  Patient presents with   Follow-up    1-1/2-year follow-up.  Doing well.  Had some low blood pressure issues.   Coronary Artery Disease    No angina    Patient Profile: .     Mark Mendoza is a  73 y.o. male current smoker with a PMH notable for CAD (reviewed below) who presents here for ~18 month f/u for CAD, HTN and HLD.   Returns today at the request of Imelda Man, MD.  CAD (diagnosed by Dr. Ed Gondola): CFRs HTN, HLD & smoking Initially PCI to LAD followed by recurrent symptoms with ISR of LAD => severe LAD ISR, 6 in OM1 and significant RCA disease CABG x 3 (LIMA-LAD, SVG-OM, SVG-RPDA) back in 2000,  Cardiac Catheterization back in 2011 for what looks to be a false positive stress test. =>Patent Grafts: Proximal LM Dz -< subtotaled LAD after the stent with significant ISR. Small LCx w/ 60% OM lesion prior to graft insertion. 100% proximal RCA occlusion.  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 Last Myoview 05/2013 following - Showed similar defect is noted in 2011. No ischemia with EF 63% Shared decision making-patient preferred to avoid further diagnostic testing unless symptoms occurred.    Mark Mendoza was last seen on January 14, 2022-he was doing well with no dyspnea at rest or with exertion.  No chest pain or pressure.  No CHF symptoms.  Blood pressures usually range from the 110-130/60-70 mmHg.  Major concern was that he was still smoking.  BP was low but more elevated in clinic today was at home.  No changes made.  Labs followed by PCP.  Subjective  Discussed the use of AI scribe software for clinical note transcription with the patient, who  gave verbal consent to proceed.  History of Present Illness History of Present Illness Mark Mendoza notes that he has been having episodes where he has had low blood pressure, particularly after taking his medications on an empty stomach. His current medications include amlodipine  10 mg, lisinopril 40 mg, and metoprolol  25 mg twice a day.  No more than baseline exertional dyspnea but no chest pain pressure or dyspnea with routine activity.  No chest pain with vigorous exertion.. Occasional palpitations described as a 'flutter' occur once every month or two, lasting for a few seconds. No history of syncope, dizziness, or headaches.  No PND, orthopnea or edema.   He remains active, engaging in daily activities such as yard work and house chores, although he notes some hip joint pain after walking long distances.  He continues to smoke about a pack a day, despite having quit for 18 years previously. He lives independently, performs his own housework and yard work, and has been divorced for a long time.   Objective   Medications - Lopressor  25 mg twice daily; - Lisinopril 40 mg daily; - Amlodipine  10 mg daily. - Atorvastatin  40 mg daily; -omega-3 fish oil 1000 mg daily  - Aspirin 81 mg daily - Synthroid  50 mcg daily  Studies Reviewed: Aaron Aas   EKG Interpretation Date/Time:  Friday Jun 09 2023 09:34:06 EDT  Ventricular Rate:  69 PR Interval:  128 QRS Duration:  94 QT Interval:  374 QTC Calculation: 400 R Axis:   33  Text Interpretation: Normal sinus rhythm Nonspecific T wave abnormality When compared with ECG of 22-Jun-2005 04:45, Criteria for Septal infarct are no longer Present No significant change since last tracing from 12/2021 Confirmed by Randene Bustard ([id number]) on 06/09/2023 9:44:10 AM    No recent studies  Labs from May 2024: white blood cell count of 7.6, hemoglobin of 13.8, hematocrit of 38.6, platelets of 342, BUN of 11, creatinine of 1.21, sodium of 131, potassium of 5.6, and normal  liver function tests. Cholesterol levels were well-controlled with a total cholesterol of 142, triglycerides of 127, HDL of 54, and LDL of 66. He is on atorvastatin  40 mg for cholesterol management. He also takes fish oil 1000 mg and Synthroid  50 mg daily.  Risk Assessment/Calculations:         Physical Exam:   VS:  BP 124/72 (BP Location: Left Arm, Patient Position: Sitting)   Pulse 69   Ht 5\' 6"  (1.676 m)   Wt 148 lb 6.4 oz (67.3 kg)   SpO2 96%   BMI 23.95 kg/m    Wt Readings from Last 3 Encounters:  06/09/23 148 lb 6.4 oz (67.3 kg)  01/14/22 157 lb 3.2 oz (71.3 kg)  10/17/19 166 lb 12.8 oz (75.7 kg)    GEN: Well nourished, well groomed in no acute distress; health appearing.  Smells of cigarettes. NECK: No JVD; No carotid bruits CARDIAC: Normal S1, S2; RRR, no murmurs, rubs, gallops RESPIRATORY:  Clear to auscultation without rales, wheezing or rhonchi ; nonlabored, good air movement. ABDOMEN: Soft, non-tender, non-distended EXTREMITIES:  No edema; No deformity; mildly diminished pedal pulses.    ASSESSMENT AND PLAN: .    Problem List Items Addressed This Visit       Cardiology Problems   CAD in native artery - s/p PTCA, then CABG (Chronic)   Doing well with no active angina.  Last stress test in 2015.  Based on the fact that this tends to show false positive findings, we have decided to forego further testing unless symptoms warrant.   - Continue aspirin 81 mg daily along with atorvastatin  40 mg daily,  - Continue Lopressor  25 mg twice daily and milligram daily for antianginal benefit along with lisinopril 40 mg daily additional afterload reduction  Smoking cessation counseling      Essential hypertension - Primary (Chronic)   Hypertension with some hypotension episodes.  May be due to taking medications on an empty stomach prior to eating and drinking in the morning. Will continue current medications but make adjustments to allow for splitting up medications:  Lopressor  25 mg twice daily, amlodipine  10 mg daily and lisinopril 40 mg daily. - Take lisinopril in the morning, amlodipine  at night. - Take morning medications after breakfast or at lunch if breakfast is skipped.      Relevant Orders   EKG 12-Lead (Completed)   Hyperlipidemia with target low density lipoprotein (LDL) cholesterol less than 55 mg/dL (Chronic)   Managed with atorvastatin  40 mg. LDL at 66, target <55. - Continue atorvastatin  40 mg. - Monitor cholesterol levels; consider more aggressive management if levels increase. => Would probably consider Nexlizet if labs are relatively close, however however if labs go above 70 would consider PCSK9 Amber referral.        Other   Current smoker   Smoking cessation instruction/counseling given:  counseled patient on  the dangers of tobacco use, advised patient to stop smoking, and reviewed strategies to maximize success 3 1/2 minutes       Intermittent palpitations (Chronic)   No more complaints on current dose of beta-blocker.      Other Visit Diagnoses       Coronary artery disease involving native coronary artery of native heart without angina pectoris       Relevant Orders   EKG 12-Lead (Completed)            Follow-Up: Return in about 1 year (around 06/08/2024) for Routine follow up with me or APP.Myrtle Atta, Arleen Lacer, MD, MS Randene Bustard, M.D., M.S. Interventional Chartered certified accountant  Pager # [phone]

## 2023-06-09 NOTE — Patient Instructions (Signed)
 Medication Instructions:    TAKE MORNING MEDICATION AFTER YOU EAT BREAKFAST   TAKE  LISINOPRIL WITH MORNING MEDICATIONS  TAKE AMLODIPINE  WITH EVENING MEDICATION  *If you need a refill on your cardiac medications before your next appointment, please call your pharmacy*   Lab Work:  If you have labs (blood work) drawn today and your tests are completely normal, you will receive your results only by: MyChart Message (if you have MyChart) OR A paper copy in the mail If you have any lab test that is abnormal or we need to change your treatment, we will call you to review the results.   Testing/Procedures:  NOT NEEDED  Follow-Up: At [facility], you and your health needs are our priority.  As part of our continuing mission to provide you with exceptional heart care, we have created designated Provider Care Teams.  These Care Teams include your primary Cardiologist (physician) and Advanced Practice Providers (APPs -  Physician Assistants and Nurse Practitioners) who all work together to provide you with the care you need, when you need it.     Your next appointment:   12 month(s)  The format for your next appointment:   In Person  Provider:   Randene Bustard, MD

## 2023-06-10 ENCOUNTER — Encounter: Payer: Self-pay | Admitting: Cardiology

## 2023-06-10 NOTE — Assessment & Plan Note (Signed)
 Smoking cessation instruction/counseling given:  counseled patient on the dangers of tobacco use, advised patient to stop smoking, and reviewed strategies to maximize success 3 1/2 minutes

## 2023-06-10 NOTE — Assessment & Plan Note (Signed)
 No more complaints on current dose of beta-blocker.

## 2023-06-10 NOTE — Assessment & Plan Note (Addendum)
 Doing well with no active angina.  Last stress test in 2015.  Based on the fact that this tends to show false positive findings, we have decided to forego further testing unless symptoms warrant.   - Continue aspirin 81 mg daily along with atorvastatin  40 mg daily,  - Continue Lopressor  25 mg twice daily and milligram daily for antianginal benefit along with lisinopril 40 mg daily additional afterload reduction  Smoking cessation counseling

## 2023-06-10 NOTE — Assessment & Plan Note (Signed)
 Hypertension with some hypotension episodes.  May be due to taking medications on an empty stomach prior to eating and drinking in the morning. Will continue current medications but make adjustments to allow for splitting up medications: Lopressor  25 mg twice daily, amlodipine  10 mg daily and lisinopril 40 mg daily. - Take lisinopril in the morning, amlodipine  at night. - Take morning medications after breakfast or at lunch if breakfast is skipped.

## 2023-06-10 NOTE — Assessment & Plan Note (Addendum)
 Managed with atorvastatin  40 mg. LDL at 66, target <55. - Continue atorvastatin  40 mg. - Monitor cholesterol levels; consider more aggressive management if levels increase. => Would probably consider Nexlizet if labs are relatively close, however however if labs go above 70 would consider PCSK9 Amber referral.

## 2023-06-12 ENCOUNTER — Other Ambulatory Visit: Payer: Self-pay | Admitting: Student

## 2023-06-13 DIAGNOSIS — I2510 Atherosclerotic heart disease of native coronary artery without angina pectoris: Secondary | ICD-10-CM | POA: Diagnosis not present

## 2023-06-13 DIAGNOSIS — L578 Other skin changes due to chronic exposure to nonionizing radiation: Secondary | ICD-10-CM | POA: Diagnosis not present

## 2023-06-13 DIAGNOSIS — Z951 Presence of aortocoronary bypass graft: Secondary | ICD-10-CM | POA: Diagnosis not present

## 2023-06-13 DIAGNOSIS — R748 Abnormal levels of other serum enzymes: Secondary | ICD-10-CM | POA: Diagnosis not present

## 2023-06-13 DIAGNOSIS — I10 Essential (primary) hypertension: Secondary | ICD-10-CM | POA: Diagnosis not present

## 2023-06-13 DIAGNOSIS — K219 Gastro-esophageal reflux disease without esophagitis: Secondary | ICD-10-CM | POA: Diagnosis not present

## 2023-06-13 DIAGNOSIS — E039 Hypothyroidism, unspecified: Secondary | ICD-10-CM | POA: Diagnosis not present

## 2023-06-13 DIAGNOSIS — F17210 Nicotine dependence, cigarettes, uncomplicated: Secondary | ICD-10-CM | POA: Diagnosis not present

## 2023-06-13 DIAGNOSIS — Z85818 Personal history of malignant neoplasm of other sites of lip, oral cavity, and pharynx: Secondary | ICD-10-CM | POA: Diagnosis not present

## 2023-06-13 DIAGNOSIS — Z0000 Encounter for general adult medical examination without abnormal findings: Secondary | ICD-10-CM | POA: Diagnosis not present

## 2023-07-13 ENCOUNTER — Other Ambulatory Visit: Payer: Self-pay | Admitting: Cardiology

## 2023-08-03 ENCOUNTER — Other Ambulatory Visit: Payer: Self-pay | Admitting: Cardiology
